# Patient Record
Sex: Male | Born: 1975 | Race: Black or African American | Hispanic: No | Marital: Single | Smoking: Former smoker
Health system: Southern US, Community
[De-identification: ages and names within clinical notes are randomized; demographics above are authoritative.]

## PROBLEM LIST (undated history)

## (undated) DIAGNOSIS — R519 Headache, unspecified: Secondary | ICD-10-CM

## (undated) DIAGNOSIS — K219 Gastro-esophageal reflux disease without esophagitis: Secondary | ICD-10-CM

## (undated) HISTORY — PX: WISDOM TOOTH EXTRACTION: SHX21

## (undated) HISTORY — PX: FOOT SURGERY: SHX648

---
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
## 2005-12-30 ENCOUNTER — Emergency Department (HOSPITAL_COMMUNITY): Admission: EM | Admit: 2005-12-30 | Discharge: 2005-12-30 | Payer: Self-pay | Admitting: Emergency Medicine

## 2006-11-13 ENCOUNTER — Emergency Department (HOSPITAL_COMMUNITY): Admission: EM | Admit: 2006-11-13 | Discharge: 2006-11-13 | Payer: Self-pay | Admitting: Emergency Medicine

## 2008-03-25 ENCOUNTER — Emergency Department (HOSPITAL_COMMUNITY): Admission: EM | Admit: 2008-03-25 | Discharge: 2008-03-25 | Payer: Self-pay | Admitting: Emergency Medicine

## 2008-03-25 ENCOUNTER — Encounter (INDEPENDENT_AMBULATORY_CARE_PROVIDER_SITE_OTHER): Payer: Self-pay | Admitting: Emergency Medicine

## 2008-03-25 ENCOUNTER — Ambulatory Visit: Payer: Self-pay | Admitting: Vascular Surgery

## 2010-01-26 ENCOUNTER — Emergency Department (HOSPITAL_COMMUNITY): Admission: EM | Admit: 2010-01-26 | Discharge: 2010-01-26 | Payer: Self-pay | Admitting: Emergency Medicine

## 2011-02-04 ENCOUNTER — Emergency Department (HOSPITAL_COMMUNITY)
Admission: EM | Admit: 2011-02-04 | Discharge: 2011-02-04 | Disposition: A | Discharge status: Home / Self Care | Payer: Self-pay | Attending: Emergency Medicine | Admitting: Emergency Medicine

## 2011-02-04 DIAGNOSIS — H109 Unspecified conjunctivitis: Secondary | ICD-10-CM | POA: Insufficient documentation

## 2011-02-04 DIAGNOSIS — H5789 Other specified disorders of eye and adnexa: Secondary | ICD-10-CM | POA: Insufficient documentation

## 2012-04-07 ENCOUNTER — Emergency Department (HOSPITAL_COMMUNITY)
Admission: EM | Admit: 2012-04-07 | Discharge: 2012-04-07 | Disposition: A | Discharge status: Home / Self Care | Payer: Self-pay | Attending: Emergency Medicine | Admitting: Emergency Medicine

## 2012-04-07 ENCOUNTER — Encounter (HOSPITAL_COMMUNITY): Payer: Self-pay | Admitting: *Deleted

## 2012-04-07 DIAGNOSIS — R599 Enlarged lymph nodes, unspecified: Secondary | ICD-10-CM | POA: Insufficient documentation

## 2012-04-07 DIAGNOSIS — J029 Acute pharyngitis, unspecified: Secondary | ICD-10-CM | POA: Insufficient documentation

## 2012-04-07 LAB — RAPID STREP SCREEN (MED CTR MEBANE ONLY): Streptococcus, Group A Screen (Direct): NEGATIVE

## 2012-04-07 MED ORDER — OXYCODONE-ACETAMINOPHEN 5-325 MG PO TABS
1.0000 | ORAL_TABLET | Freq: Once | ORAL | Status: AC
  Administered 2012-04-07: 1 via ORAL
  Filled 2012-04-07: qty 1

## 2012-04-07 MED ORDER — OXYCODONE-ACETAMINOPHEN 5-325 MG PO TABS: 1.0000 | ORAL_TABLET | Freq: Four times a day (QID) | ORAL | Status: AC | PRN

## 2012-04-07 MED ORDER — PREDNISONE 20 MG PO TABS: 40.0000 mg | ORAL_TABLET | Freq: Every day | ORAL | Status: DC

## 2012-04-07 NOTE — ED Provider Notes (Signed)
History     CSN: [id number]  Arrival date & time 04/07/12  1621   First MD Initiated Contact with Patient 04/07/12 1710      Chief Complaint  Patient presents with  . Dysphagia    (Consider location/radiation/quality/duration/timing/severity/associated sxs/prior treatment) The history is provided by the patient.   she's had difficulty swallowing for last 3 weeks. He states it comes and goes. States he choked and it trouble breathing today. It is worse on left side. No fevers. He states he has some swelling in his neck. No chest pain. No weight loss.  History reviewed. No pertinent past medical history.  Past Surgical History  Procedure Date  . Foot surgery     1997    No family history on file.  History  Substance Use Topics  . Smoking status: Current Everyday Smoker -- 1.0 packs/day    Types: Cigarettes  . Smokeless tobacco: Not on file  . Alcohol Use: Yes     occa      Review of Systems  Constitutional: Negative for fever and fatigue.  HENT: Positive for sore throat and trouble swallowing. Negative for dental problem and sinus pressure.   Respiratory: Positive for choking.   Cardiovascular: Negative for chest pain.  Gastrointestinal: Negative for abdominal pain.  Musculoskeletal: Negative for back pain.  Neurological: Negative for headaches.  Hematological: Positive for adenopathy.    Allergies  Review of patient's allergies indicates no known allergies.  Home Medications   Current Outpatient Rx  Name Route Sig Dispense Refill  . OXYCODONE-ACETAMINOPHEN 5-325 MG PO TABS Oral Take 1-2 tablets by mouth every 6 (six) hours as needed for pain. 10 tablet 0  . PREDNISONE 20 MG PO TABS Oral Take 2 tablets (40 mg total) by mouth daily. 6 tablet 0    BP 133/83  Pulse 68  Temp(Src) 98.8 F (37.1 C) (Oral)  Resp 20  SpO2 99%  Physical Exam  Constitutional: He is oriented to person, place, and time. He appears well-developed and well-nourished.  HENT:    Mouth/Throat: No oropharyngeal exudate.       Mild posterior pharyngeal erythema with tiny vesicles.  Eyes: Conjunctivae are normal. Pupils are equal, round, and reactive to light.  Neck:       Left-sided anterior cervical lymphadenopathy  Cardiovascular: Normal rate and regular rhythm.   Pulmonary/Chest: Effort normal. No respiratory distress. He has no wheezes.  Abdominal: There is no tenderness.  Lymphadenopathy:    He has cervical adenopathy.  Neurological: He is alert and oriented to person, place, and time.    ED Course  Procedures (including critical care time)   Labs Reviewed  RAPID STREP SCREEN   No results found.   1. Pharyngitis       MDM  Patient with sore throat. He has posterior frontal erythema with small vesicular lesions. Also has some mild anterior cervical lymphadenopathy. Negative strep test. Patient discharged home with some prednisone and oxycodone. He'll followup with ENT as needed        Juliet Rude. Rubin Payor, MD 04/07/12 1920

## 2012-04-07 NOTE — ED Notes (Signed)
Pt reports dysphagia x 3 weeks.  Pt reports swollen neck but denies any difficulty breathing at this time.

## 2012-04-07 NOTE — Discharge Instructions (Signed)

## 2014-07-29 ENCOUNTER — Emergency Department (HOSPITAL_COMMUNITY)
Admission: EM | Admit: 2014-07-29 | Discharge: 2014-07-29 | Disposition: A | Discharge status: Home / Self Care | Payer: Self-pay | Attending: Emergency Medicine | Admitting: Emergency Medicine

## 2014-07-29 ENCOUNTER — Encounter (HOSPITAL_COMMUNITY): Payer: Self-pay | Admitting: Emergency Medicine

## 2014-07-29 DIAGNOSIS — J029 Acute pharyngitis, unspecified: Secondary | ICD-10-CM | POA: Insufficient documentation

## 2014-07-29 DIAGNOSIS — F17200 Nicotine dependence, unspecified, uncomplicated: Secondary | ICD-10-CM | POA: Insufficient documentation

## 2014-07-29 LAB — RAPID STREP SCREEN (MED CTR MEBANE ONLY): Streptococcus, Group A Screen (Direct): NEGATIVE

## 2014-07-29 MED ORDER — IBUPROFEN 800 MG PO TABS
800.0000 mg | ORAL_TABLET | Freq: Once | ORAL | Status: AC
  Administered 2014-07-29: 800 mg via ORAL
  Filled 2014-07-29: qty 1

## 2014-07-29 MED ORDER — IBUPROFEN 800 MG PO TABS: 800.0000 mg | ORAL_TABLET | Freq: Three times a day (TID) | ORAL | Status: DC

## 2014-07-29 MED ORDER — FLUTICASONE PROPIONATE 50 MCG/ACT NA SUSP: 2.0000 | Freq: Every day | NASAL | Status: DC

## 2014-07-29 NOTE — Discharge Instructions (Signed)
Sore Throat A sore throat is a painful, burning, sore, or scratchy feeling of the throat. There may be pain or tenderness when swallowing or talking. You may have other symptoms with a sore throat. These include coughing, sneezing, fever, or a swollen neck. A sore throat is often the first sign of another sickness. These sicknesses may include a cold, flu, strep throat, or an infection called mono. Most sore throats go away without medical treatment.  HOME CARE   Only take medicine as told by your doctor.  Drink enough fluids to keep your pee (urine) clear or pale yellow.  Rest as needed.  Try using throat sprays, lozenges, or suck on hard candy (if older than 4 years or as told).  Sip warm liquids, such as broth, herbal tea, or warm water with honey. Try sucking on frozen ice pops or drinking cold liquids.  Rinse the mouth (gargle) with salt water. Mix 1 teaspoon salt with 8 ounces of water.  Do not smoke. Avoid being around others when they are smoking.  Put a humidifier in your bedroom at night to moisten the air. You can also turn on a hot shower and sit in the bathroom for 5-10 minutes. Be sure the bathroom door is closed. GET HELP RIGHT AWAY IF:   You have trouble breathing.  You cannot swallow fluids, soft foods, or your spit (saliva).  You have more puffiness (swelling) in the throat.  Your sore throat does not get better in 7 days.  You feel sick to your stomach (nauseous) and throw up (vomit).  You have a fever or lasting symptoms for more than 2-3 days.  You have a fever and your symptoms suddenly get worse. MAKE SURE YOU:   Understand these instructions.  Will watch your condition.  Will get help right away if you are not doing well or get worse. Document Released: 09/18/2008 Document Revised: 09/03/2012 Document Reviewed: 08/17/2012 Exit[employer] Patient Information 2015 [employer]. This information is not intended to replace advice given to you by your health  care provider. Make sure you discuss any questions you have with your health care provider.  

## 2014-07-29 NOTE — ED Notes (Signed)
Pt reports sore throat x 4 days that is painful to swallow with some cough that is productive. Denies fever at home and denies n/v/d.

## 2014-07-29 NOTE — ED Provider Notes (Signed)
CSN: [id number]     Arrival date & time 07/29/14  1513 History   First MD Initiated Contact with Patient 07/29/14 1545    This chart was scribed for non-physician practitioner, Junious SilkHannah Jestine Bicknell, working with Toy CookeyMegan Docherty, MD by Marica OtterNusrat Rahman, ED Scribe. This patient was seen in room WTR7/WTR7 and the patient's care was started at 4:46 PM.  No chief complaint on file.  The history is provided by the patient. No language interpreter was used.   HPI Comments: Phillip Wiley is a 38 y.o. male who presents to the Emergency Department complaining of constant sore throat onset 4 days ago. Pt states it is painful to swallow and complains of associated mild congestion. Pt reports using throat lozenges and gurgling with listerine at home without much relief. PT denies taking tylenol or ibuprofen at home.   Pt denies fever at home, n/v/d, rhinorrhea, cough, abd pain, or ear pain.   History reviewed. No pertinent past medical history. Past Surgical History  Procedure Laterality Date  . Foot surgery      1997   No family history on file. History  Substance Use Topics  . Smoking status: Current Every Day Smoker -- 1.00 packs/day    Types: Cigarettes  . Smokeless tobacco: Not on file  . Alcohol Use: Yes     Comment: occa    Review of Systems  Constitutional: Negative for fever and chills.  HENT: Positive for congestion and sore throat (painful to swallow). Negative for ear pain and rhinorrhea.   Gastrointestinal: Negative for abdominal pain.  Psychiatric/Behavioral: Negative for confusion.  All other systems reviewed and are negative.  Allergies  Review of patient's allergies indicates no known allergies.  Home Medications   Prior to Admission medications   Medication Sig Start Date End Date Taking? Authorizing Provider  Ascorbic Acid (HALLS VITAMIN C DROPS MT) Use as directed 1 each in the mouth or throat once as needed (sore throat).   Yes Historical Provider, MD   Triage Vitals: BP  124/82  Pulse 82  Temp(Src) 99.4 F (37.4 C) (Oral)  Resp 16  SpO2 100% Physical Exam  Nursing note and vitals reviewed. Constitutional: He is oriented to person, place, and time. He appears well-developed and well-nourished. No distress.  HENT:  Head: Normocephalic and atraumatic.  Right Ear: Tympanic membrane and external ear normal.  Left Ear: Tympanic membrane and external ear normal.  Nose: Nose normal.  Mouth/Throat: Uvula is midline. No trismus in the jaw. No uvula swelling. Posterior oropharyngeal erythema present.  Mild bilateral enlargement of tonsils. Post nasal drip.   Eyes: Conjunctivae are normal.  Neck: Normal range of motion. No tracheal deviation present.  Cardiovascular: Normal rate, regular rhythm and normal heart sounds.   Pulmonary/Chest: Effort normal and breath sounds normal. No stridor.  Abdominal: Soft. He exhibits no distension. There is no tenderness.  Musculoskeletal: Normal range of motion.  Lymphadenopathy:    He has cervical adenopathy.  Neurological: He is alert and oriented to person, place, and time.  Skin: Skin is warm and dry. He is not diaphoretic.  Psychiatric: He has a normal mood and affect. His behavior is normal.    ED Course  Procedures (including critical care time) DIAGNOSTIC STUDIES: Oxygen Saturation is 100% on RA, nl by my interpretation.    COORDINATION OF CARE: 4:49 PM-Discussed treatment plan which includes strep tests results and meds with pt at bedside and pt agreed to plan.   Labs Review Labs Reviewed  RAPID STREP SCREEN  CULTURE, GROUP A STREP    Imaging Review No results found.   EKG Interpretation None      MDM   Final diagnoses:  Viral pharyngitis    Pt afebrile without tonsillar exudate, negative strep. Presents with mild cervical lymphadenopathy, & dysphagia; diagnosis of viral pharyngitis. No abx indicated. DC w symptomatic tx for pain  Pt does not appear dehydrated, but did discuss importance of  water rehydration. Presentation non concerning for PTA or infxn spread to soft tissue. No trismus or uvula deviation. Specific return precautions discussed. Pt able to drink water in ED without difficulty with intact air way. Recommended PCP follow up.   I personally performed the services described in this documentation, which was scribed in my presence. The recorded information has been reviewed and is accurate.    Mora Bellman, PA-C 07/29/14 2027

## 2014-07-30 NOTE — ED Provider Notes (Signed)
Medical screening examination/treatment/procedure(s) were performed by non-physician practitioner and as supervising physician I was immediately available for consultation/collaboration.  Toy CookeyMegan Docherty, MD 07/30/14 [phone]

## 2014-07-31 LAB — CULTURE, GROUP A STREP

## 2014-11-24 ENCOUNTER — Encounter (HOSPITAL_COMMUNITY): Payer: Self-pay | Admitting: Emergency Medicine

## 2014-11-24 ENCOUNTER — Emergency Department (HOSPITAL_COMMUNITY)
Admission: EM | Admit: 2014-11-24 | Discharge: 2014-11-24 | Disposition: A | Discharge status: Home / Self Care | Payer: Worker's Compensation | Attending: Emergency Medicine | Admitting: Emergency Medicine

## 2014-11-24 ENCOUNTER — Emergency Department (HOSPITAL_COMMUNITY): Payer: Worker's Compensation

## 2014-11-24 DIAGNOSIS — Z880 Allergy status to penicillin: Secondary | ICD-10-CM | POA: Insufficient documentation

## 2014-11-24 DIAGNOSIS — W228XXA Striking against or struck by other objects, initial encounter: Secondary | ICD-10-CM | POA: Insufficient documentation

## 2014-11-24 DIAGNOSIS — Z720 Tobacco use: Secondary | ICD-10-CM | POA: Diagnosis not present

## 2014-11-24 DIAGNOSIS — S6991XA Unspecified injury of right wrist, hand and finger(s), initial encounter: Secondary | ICD-10-CM | POA: Diagnosis present

## 2014-11-24 DIAGNOSIS — M67431 Ganglion, right wrist: Secondary | ICD-10-CM | POA: Diagnosis not present

## 2014-11-24 DIAGNOSIS — Y9389 Activity, other specified: Secondary | ICD-10-CM | POA: Insufficient documentation

## 2014-11-24 DIAGNOSIS — Y998 Other external cause status: Secondary | ICD-10-CM | POA: Insufficient documentation

## 2014-11-24 DIAGNOSIS — Y9289 Other specified places as the place of occurrence of the external cause: Secondary | ICD-10-CM | POA: Diagnosis not present

## 2014-11-24 MED ORDER — TRAMADOL HCL 50 MG PO TABS: 50.0000 mg | ORAL_TABLET | Freq: Four times a day (QID) | ORAL | Status: DC | PRN

## 2014-11-24 MED ORDER — IBUPROFEN 800 MG PO TABS
800.0000 mg | ORAL_TABLET | Freq: Once | ORAL | Status: AC
  Administered 2014-11-24: 800 mg via ORAL
  Filled 2014-11-24: qty 1

## 2014-11-24 NOTE — Discharge Instructions (Signed)

## 2014-11-24 NOTE — ED Provider Notes (Signed)
CSN: [id number]     Arrival date & time 11/24/14  1009 History   First MD Initiated Contact with Patient 11/24/14 1038     Chief Complaint  Patient presents with  . Hand Pain     (Consider location/radiation/quality/duration/timing/severity/associated sxs/prior Treatment) HPI Patient to the ED with complaints of right hand/wrist pain. He hit his hand on the door last Wednesday and since then has noticed a large bump on the back of his hand that is tender, but his pain is worse throughout the wrist. The pain is worsened by touch and with moving the wrist. He has not noticed redness, increased warmth or drainage. He did not have a skin injury with the hand injury. He reports looking it up online and finding that it is a "ganglion cyst". Otherwise has been feeling well, no fevers, nausea, vomiting, diarrhea, abdominal pain, SOB, CP, and diaphoresis.   History reviewed. No pertinent past medical history. Past Surgical History  Procedure Laterality Date  . Foot surgery      1997   No family history on file. History  Substance Use Topics  . Smoking status: Current Every Day Smoker -- 1.00 packs/day    Types: Cigarettes  . Smokeless tobacco: Not on file  . Alcohol Use: Yes     Comment: occa    Review of Systems  10 Systems reviewed and are negative for acute change except as noted in the HPI.    Allergies  Amoxicillin  Home Medications   Prior to Admission medications   Medication Sig Start Date End Date Taking? Authorizing Provider  fluticasone (FLONASE) 50 MCG/ACT nasal spray Place 2 sprays into both nostrils daily. Patient not taking: Reported on 11/24/2014 07/29/14   Mora BellmanHannah S Merrell, PA-C  ibuprofen (ADVIL,MOTRIN) 800 MG tablet Take 1 tablet (800 mg total) by mouth 3 (three) times daily. Patient not taking: Reported on 11/24/2014 07/29/14   Mora BellmanHannah S Merrell, PA-C  traMADol (ULTRAM) 50 MG tablet Take 1 tablet (50 mg total) by mouth every 6 (six) hours as needed. 11/24/14   Bernadett Milian  Irine SealG Taraya Steward, PA-C   BP 129/72 mmHg  Pulse 70  Temp(Src) 98.4 F (36.9 C) (Oral)  Resp 18  SpO2 97% Physical Exam  Constitutional: He appears well-developed and well-nourished. No distress.  HENT:  Head: Normocephalic and atraumatic.  Eyes: Pupils are equal, round, and reactive to light.  Neck: Normal range of motion. Neck supple.  Cardiovascular: Normal rate and regular rhythm.   Pulmonary/Chest: Effort normal.  Abdominal: Soft.  Musculoskeletal:       Arms: Neurological: He is alert.  Skin: Skin is warm and dry.  Nursing note and vitals reviewed.    ED Course  Procedures (including critical care time) Labs Review Labs Reviewed - No data to display  Imaging Review Dg Hand Complete Right  11/24/2014   CLINICAL DATA:  Acute right hand pain and swelling after hitting hand on door.  EXAM: RIGHT HAND - COMPLETE 3+ VIEW  COMPARISON:  None.  FINDINGS: There is no evidence of fracture or dislocation. There is no evidence of arthropathy or other focal bone abnormality. Soft tissues are unremarkable.  IMPRESSION: Normal right hand.   Electronically Signed   By: Roque LiasJames  Green M.D.   On: 11/24/2014 10:44     EKG Interpretation None      MDM   Final diagnoses:  Ganglion cyst of wrist, right    Wrist splint, NSAIDS, pain medication And follow-up with hand if he wants it removed or  it continues to hurt.  38 y.o.Phillip Wiley's evaluation in the Emergency Department is complete. It has been determined that no acute conditions requiring further emergency intervention are present at this time. The patient/guardian have been advised of the diagnosis and plan. We have discussed signs and symptoms that warrant return to the ED, such as changes or worsening in symptoms.  Vital signs are stable at discharge. Filed Vitals:   11/24/14 1146  BP:   Pulse: 70  Temp:   Resp:     Patient/guardian has voiced understanding and agreed to follow-up with the PCP or  specialist.     Dorthula Matasiffany G Ravinder Lukehart, PA-C 11/24/14 1201  Raeford RazorStephen Kohut, MD 11/24/14 1943

## 2014-11-24 NOTE — ED Notes (Signed)
Pt states he hit his hand on a door last Wednesday and has been having rt hand pain since.  Came last night but wait was too long.  Lump noted to top of rt hand.

## 2015-07-17 ENCOUNTER — Encounter (HOSPITAL_COMMUNITY): Payer: Self-pay

## 2015-07-17 ENCOUNTER — Emergency Department (HOSPITAL_COMMUNITY)
Admission: EM | Admit: 2015-07-17 | Discharge: 2015-07-17 | Disposition: A | Discharge status: Home / Self Care | Payer: Self-pay | Attending: Emergency Medicine | Admitting: Emergency Medicine

## 2015-07-17 DIAGNOSIS — Z880 Allergy status to penicillin: Secondary | ICD-10-CM | POA: Insufficient documentation

## 2015-07-17 DIAGNOSIS — Z720 Tobacco use: Secondary | ICD-10-CM | POA: Insufficient documentation

## 2015-07-17 DIAGNOSIS — Y998 Other external cause status: Secondary | ICD-10-CM | POA: Insufficient documentation

## 2015-07-17 DIAGNOSIS — Y92002 Bathroom of unspecified non-institutional (private) residence single-family (private) house as the place of occurrence of the external cause: Secondary | ICD-10-CM | POA: Insufficient documentation

## 2015-07-17 DIAGNOSIS — S60412A Abrasion of right middle finger, initial encounter: Secondary | ICD-10-CM | POA: Insufficient documentation

## 2015-07-17 DIAGNOSIS — Z7951 Long term (current) use of inhaled steroids: Secondary | ICD-10-CM | POA: Insufficient documentation

## 2015-07-17 DIAGNOSIS — Y9389 Activity, other specified: Secondary | ICD-10-CM | POA: Insufficient documentation

## 2015-07-17 DIAGNOSIS — S60419A Abrasion of unspecified finger, initial encounter: Secondary | ICD-10-CM

## 2015-07-17 DIAGNOSIS — W228XXA Striking against or struck by other objects, initial encounter: Secondary | ICD-10-CM | POA: Insufficient documentation

## 2015-07-17 MED ORDER — LIDOCAINE HCL 1 % IJ SOLN
INTRAMUSCULAR | Status: AC
  Administered 2015-07-17: 22:00:00
  Filled 2015-07-17: qty 20

## 2015-07-17 MED ORDER — IBUPROFEN 800 MG PO TABS: 800.0000 mg | ORAL_TABLET | Freq: Three times a day (TID) | ORAL | Status: DC

## 2015-07-17 NOTE — ED Notes (Signed)
Pt presents with c/o right middle finger laceration that occurred earlier today after the rubber part of the broom came off and he cut himself on the end of the broom. Pt reports the laceration is approx one inch, bleeding is showing through the band-aid at this time but is controlled.

## 2015-07-17 NOTE — ED Provider Notes (Signed)
CSN: [id number]     Arrival date & time 07/17/15  1928 History   First MD Initiated Contact with Patient 07/17/15 2049     Chief Complaint  Patient presents with  . Extremity Laceration     (Consider location/radiation/quality/duration/timing/severity/associated sxs/prior Treatment) HPI Comments: Patient presents with laceration to right middle finger occurring this morning. He was using a broom this morning with a broken plastic edge on the handle that caused laceration to distal lateral finger. No other injury. He spent today trying to get bleeding under control but could not, prompting visit to emergency department.   The history is provided by the patient. No language interpreter was used.    History reviewed. No pertinent past medical history. Past Surgical History  Procedure Laterality Date  . Foot surgery      1997   No family history on file. History  Substance Use Topics  . Smoking status: Current Every Day Smoker -- 1.00 packs/day    Types: Cigarettes  . Smokeless tobacco: Not on file  . Alcohol Use: Yes     Comment: occa    Review of Systems  Constitutional: Negative for fever and chills.  Musculoskeletal: Negative.   Skin:       Laceration.  Neurological: Negative.  Negative for numbness.      Allergies  Amoxicillin  Home Medications   Prior to Admission medications   Medication Sig Start Date End Date Taking? Authorizing Provider  fluticasone (FLONASE) 50 MCG/ACT nasal spray Place 2 sprays into both nostrils daily. Patient not taking: Reported on 11/24/2014 07/29/14   Junious Silk, PA-C  ibuprofen (ADVIL,MOTRIN) 800 MG tablet Take 1 tablet (800 mg total) by mouth 3 (three) times daily. Patient not taking: Reported on 11/24/2014 07/29/14   Junious Silk, PA-C  traMADol (ULTRAM) 50 MG tablet Take 1 tablet (50 mg total) by mouth every 6 (six) hours as needed. 11/24/14   Tiffany Neva Seat, PA-C   BP 116/69 mmHg  Pulse 86  Temp(Src) 98 F (36.7 C) (Oral)   Resp 16  SpO2 98% Physical Exam  Constitutional: He is oriented to person, place, and time. He appears well-developed and well-nourished.  Neck: Normal range of motion.  Pulmonary/Chest: Effort normal.  Musculoskeletal: Normal range of motion.  Neurological: He is alert and oriented to person, place, and time.  Skin: Skin is warm and dry.  2 cm x 0.5 cm deep abrasion, non-suturable, to lateral aspect distal right middle finger. No active bleeding. Nail intact. FROM.  Psychiatric: He has a normal mood and affect.    ED Course  Procedures (including critical care time) Labs Review Labs Reviewed - No data to display  Imaging Review No results found.   EKG Interpretation None      MDM   Final diagnoses:  None    1. Finger abrasion  No active bleeding. Finger cleaned, nonstick bandage, care instructions provided.   Elpidio Anis, PA-C 07/17/15 2135  Purvis Sheffield, MD 07/18/15 [phone]

## 2015-07-17 NOTE — Discharge Instructions (Signed)
Abrasion °An abrasion is a cut or scrape of the skin. Abrasions do not extend through all layers of the skin and most heal within 10 days. It is important to care for your abrasion properly to prevent infection. °CAUSES  °Most abrasions are caused by falling on, or gliding across, the ground or other surface. When your skin rubs on something, the outer and inner layer of skin rubs off, causing an abrasion. °DIAGNOSIS  °Your caregiver will be able to diagnose an abrasion during a physical exam.  °TREATMENT  °Your treatment depends on how large and deep the abrasion is. Generally, your abrasion will be cleaned with water and a mild soap to remove any dirt or debris. An antibiotic ointment may be put over the abrasion to prevent an infection. A bandage (dressing) may be wrapped around the abrasion to keep it from getting dirty.  °You may need a tetanus shot if: °· You cannot remember when you had your last tetanus shot. °· You have never had a tetanus shot. °· The injury broke your skin. °If you get a tetanus shot, your arm may swell, get red, and feel warm to the touch. This is common and not a problem. If you need a tetanus shot and you choose not to have one, there is a rare chance of getting tetanus. Sickness from tetanus can be serious.  °HOME CARE INSTRUCTIONS  °· If a dressing was applied, change it at least once a day or as directed by your caregiver. If the bandage sticks, soak it off with warm water.   °· Wash the area with water and a mild soap to remove all the ointment 2 times a day. Rinse off the soap and pat the area dry with a clean towel.   °· Reapply any ointment as directed by your caregiver. This will help prevent infection and keep the bandage from sticking. Use gauze over the wound and under the dressing to help keep the bandage from sticking.   °· Change your dressing right away if it becomes wet or dirty.   °· Only take over-the-counter or prescription medicines for pain, discomfort, or fever as  directed by your caregiver.   °· Follow up with your caregiver within 24-48 hours for a wound check, or as directed. If you were not given a wound-check appointment, look closely at your abrasion for redness, swelling, or pus. These are signs of infection. °SEEK IMMEDIATE MEDICAL CARE IF:  °· You have increasing pain in the wound.   °· You have redness, swelling, or tenderness around the wound.   °· You have pus coming from the wound.   °· You have a fever or persistent symptoms for more than 2-3 days. °· You have a fever and your symptoms suddenly get worse. °· You have a bad smell coming from the wound or dressing.   °MAKE SURE YOU:  °· Understand these instructions. °· Will watch your condition. °· Will get help right away if you are not doing well or get worse. °Document Released: 09/19/2005 Document Revised: 11/26/2012 Document Reviewed: 11/13/2011 °[employer]® Patient Information ©2015 [employer]. This information is not intended to replace advice given to you by your health care provider. Make sure you discuss any questions you have with your health care provider. ° °Wound Care °Wound care helps prevent pain and infection.  °You may need a tetanus shot if: °· You cannot remember when you had your last tetanus shot. °· You have never had a tetanus shot. °· The injury broke your skin. °If you need   a tetanus shot and you choose not to have one, you may get tetanus. Sickness from tetanus can be serious. °HOME CARE  °· Only take medicine as told by your doctor. °· Clean the wound daily with mild soap and water. °· Change any bandages (dressings) as told by your doctor. °· Put medicated cream and a bandage on the wound as told by your doctor. °· Change the bandage if it gets wet, dirty, or starts to smell. °· Take showers. Do not take baths, swim, or do anything that puts your wound under water. °· Rest and raise (elevate) the wound until the pain and puffiness (swelling) are better. °· Keep all doctor visits as  told. °GET HELP RIGHT AWAY IF:  °· Yellowish-white fluid (pus) comes from the wound. °· Medicine does not lessen your pain. °· There is a red streak going away from the wound. °· You have a fever. °MAKE SURE YOU:  °· Understand these instructions. °· Will watch your condition. °· Will get help right away if you are not doing well or get worse. °Document Released: 09/18/2008 Document Revised: 03/03/2012 Document Reviewed: 04/15/2011 °ExitCare® Patient Information ©2015 [employer]. This information is not intended to replace advice given to you by your health care provider. Make sure you discuss any questions you have with your health care provider. ° °

## 2015-07-26 ENCOUNTER — Ambulatory Visit
Admission: RE | Admit: 2015-07-26 | Discharge: 2015-07-26 | Disposition: A | Discharge status: Home / Self Care | Payer: Self-pay | Source: Ambulatory Visit | Attending: Radiation Oncology | Admitting: Radiation Oncology

## 2015-07-26 ENCOUNTER — Ambulatory Visit: Admission: RE | Admit: 2015-07-26 | Payer: Self-pay | Source: Ambulatory Visit | Admitting: Radiation Oncology

## 2015-07-26 ENCOUNTER — Encounter: Payer: Self-pay | Admitting: Radiation Oncology

## 2016-02-26 ENCOUNTER — Emergency Department (HOSPITAL_COMMUNITY): Payer: BLUE CROSS/BLUE SHIELD

## 2016-02-26 ENCOUNTER — Encounter (HOSPITAL_COMMUNITY): Payer: Self-pay | Admitting: Nurse Practitioner

## 2016-02-26 ENCOUNTER — Emergency Department (HOSPITAL_COMMUNITY)
Admission: EM | Admit: 2016-02-26 | Discharge: 2016-02-26 | Disposition: A | Discharge status: Home / Self Care | Payer: BLUE CROSS/BLUE SHIELD | Attending: Emergency Medicine | Admitting: Emergency Medicine

## 2016-02-26 DIAGNOSIS — Y92410 Unspecified street and highway as the place of occurrence of the external cause: Secondary | ICD-10-CM | POA: Diagnosis not present

## 2016-02-26 DIAGNOSIS — Y998 Other external cause status: Secondary | ICD-10-CM | POA: Insufficient documentation

## 2016-02-26 DIAGNOSIS — F17210 Nicotine dependence, cigarettes, uncomplicated: Secondary | ICD-10-CM | POA: Insufficient documentation

## 2016-02-26 DIAGNOSIS — Z880 Allergy status to penicillin: Secondary | ICD-10-CM | POA: Diagnosis not present

## 2016-02-26 DIAGNOSIS — Z791 Long term (current) use of non-steroidal anti-inflammatories (NSAID): Secondary | ICD-10-CM | POA: Insufficient documentation

## 2016-02-26 DIAGNOSIS — S6992XA Unspecified injury of left wrist, hand and finger(s), initial encounter: Secondary | ICD-10-CM | POA: Diagnosis not present

## 2016-02-26 DIAGNOSIS — M545 Low back pain, unspecified: Secondary | ICD-10-CM

## 2016-02-26 DIAGNOSIS — Z7951 Long term (current) use of inhaled steroids: Secondary | ICD-10-CM | POA: Diagnosis not present

## 2016-02-26 DIAGNOSIS — M25532 Pain in left wrist: Secondary | ICD-10-CM

## 2016-02-26 DIAGNOSIS — Y9389 Activity, other specified: Secondary | ICD-10-CM | POA: Insufficient documentation

## 2016-02-26 DIAGNOSIS — S29002A Unspecified injury of muscle and tendon of back wall of thorax, initial encounter: Secondary | ICD-10-CM | POA: Diagnosis not present

## 2016-02-26 DIAGNOSIS — S3992XA Unspecified injury of lower back, initial encounter: Secondary | ICD-10-CM | POA: Insufficient documentation

## 2016-02-26 MED ORDER — IBUPROFEN 800 MG PO TABS
800.0000 mg | ORAL_TABLET | Freq: Once | ORAL | Status: AC
  Administered 2016-02-26: 800 mg via ORAL
  Filled 2016-02-26: qty 1

## 2016-02-26 MED ORDER — METHOCARBAMOL 500 MG PO TABS: 1000.0000 mg | ORAL_TABLET | Freq: Four times a day (QID) | ORAL | Status: DC

## 2016-02-26 MED ORDER — NAPROXEN 500 MG PO TABS: 500.0000 mg | ORAL_TABLET | Freq: Two times a day (BID) | ORAL | Status: DC

## 2016-02-26 MED ORDER — METHOCARBAMOL 500 MG PO TABS
1000.0000 mg | ORAL_TABLET | Freq: Once | ORAL | Status: AC
  Administered 2016-02-26: 1000 mg via ORAL
  Filled 2016-02-26: qty 2
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
## 2016-03-03 ENCOUNTER — Ambulatory Visit (INDEPENDENT_AMBULATORY_CARE_PROVIDER_SITE_OTHER): Payer: BLUE CROSS/BLUE SHIELD | Admitting: Family Medicine

## 2016-03-03 VITALS — BP 122/74 | HR 65 | Temp 98.1°F | Resp 12 | Ht 72.0 in | Wt 175.0 lb

## 2016-03-03 DIAGNOSIS — S39012A Strain of muscle, fascia and tendon of lower back, initial encounter: Secondary | ICD-10-CM | POA: Diagnosis not present

## 2016-03-03 MED ORDER — PREDNISONE 20 MG PO TABS: ORAL_TABLET | ORAL | Status: DC

## 2016-03-03 MED ORDER — TRAMADOL HCL 50 MG PO TABS: 50.0000 mg | ORAL_TABLET | Freq: Three times a day (TID) | ORAL | Status: DC | PRN

## 2016-03-03 NOTE — Patient Instructions (Addendum)
IF you received an x-ray today, you will receive an invoice from [facility]. Please contact [facility] at [phone] with questions or concerns regarding your invoice.   IF you received labwork today, you will receive an invoice from [facility]. Please contact [facility] at [phone] with questions or concerns regarding your invoice.   Our billing staff will not be able to assist you with questions regarding bills from these companies.  You will be contacted with the lab results as soon as they are available. The fastest way to get your results is to activate your My Chart account. Instructions are located on the last page of this paperwork. If you have not heard from us regarding the results in 2 weeks, please contact this office.  Please return some time on Wednesday for recheck
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
## 2016-03-08 ENCOUNTER — Other Ambulatory Visit: Payer: Self-pay | Admitting: Family Medicine

## 2016-03-09 NOTE — Telephone Encounter (Signed)
Pt got a 10 day supplyon 3/11. Do you want to RF? Do you want a RF date on it? Pended w/date.

## 2016-03-10 ENCOUNTER — Emergency Department (INDEPENDENT_AMBULATORY_CARE_PROVIDER_SITE_OTHER)
Admission: EM | Admit: 2016-03-10 | Discharge: 2016-03-10 | Disposition: A | Discharge status: Home / Self Care | Payer: Self-pay | Source: Home / Self Care | Attending: Family Medicine | Admitting: Family Medicine

## 2016-03-10 ENCOUNTER — Encounter (HOSPITAL_COMMUNITY): Payer: Self-pay | Admitting: Emergency Medicine

## 2016-03-10 DIAGNOSIS — S39012A Strain of muscle, fascia and tendon of lower back, initial encounter: Secondary | ICD-10-CM

## 2016-03-10 DIAGNOSIS — M5442 Lumbago with sciatica, left side: Secondary | ICD-10-CM

## 2016-03-10 MED ORDER — TRAMADOL HCL 50 MG PO TABS: 50.0000 mg | ORAL_TABLET | Freq: Three times a day (TID) | ORAL | Status: DC | PRN

## 2016-03-10 MED ORDER — CYCLOBENZAPRINE HCL 10 MG PO TABS: 10.0000 mg | ORAL_TABLET | Freq: Three times a day (TID) | ORAL | Status: DC | PRN
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
## 2016-03-10 NOTE — Discharge Instructions (Signed)

## 2016-05-08 ENCOUNTER — Telehealth: Payer: Self-pay

## 2016-05-08 DIAGNOSIS — G8929 Other chronic pain: Secondary | ICD-10-CM

## 2016-05-08 DIAGNOSIS — M545 Low back pain: Principal | ICD-10-CM

## 2016-05-08 NOTE — Telephone Encounter (Signed)
Pt left message wanting to know if he can be referred to a Pain Management Clinic please

## 2016-06-21 ENCOUNTER — Emergency Department (HOSPITAL_COMMUNITY)
Admission: EM | Admit: 2016-06-21 | Discharge: 2016-06-21 | Disposition: A | Discharge status: Home / Self Care | Payer: BLUE CROSS/BLUE SHIELD | Attending: Emergency Medicine | Admitting: Emergency Medicine

## 2016-06-21 ENCOUNTER — Emergency Department (HOSPITAL_COMMUNITY): Payer: BLUE CROSS/BLUE SHIELD

## 2016-06-21 ENCOUNTER — Encounter (HOSPITAL_COMMUNITY): Payer: Self-pay

## 2016-06-21 DIAGNOSIS — Y999 Unspecified external cause status: Secondary | ICD-10-CM | POA: Insufficient documentation

## 2016-06-21 DIAGNOSIS — Y92410 Unspecified street and highway as the place of occurrence of the external cause: Secondary | ICD-10-CM | POA: Diagnosis not present

## 2016-06-21 DIAGNOSIS — Z79899 Other long term (current) drug therapy: Secondary | ICD-10-CM | POA: Insufficient documentation

## 2016-06-21 DIAGNOSIS — M545 Low back pain: Secondary | ICD-10-CM | POA: Diagnosis present

## 2016-06-21 DIAGNOSIS — F1290 Cannabis use, unspecified, uncomplicated: Secondary | ICD-10-CM | POA: Insufficient documentation

## 2016-06-21 DIAGNOSIS — Z7952 Long term (current) use of systemic steroids: Secondary | ICD-10-CM | POA: Diagnosis not present

## 2016-06-21 DIAGNOSIS — Z87891 Personal history of nicotine dependence: Secondary | ICD-10-CM | POA: Insufficient documentation

## 2016-06-21 DIAGNOSIS — Z7951 Long term (current) use of inhaled steroids: Secondary | ICD-10-CM | POA: Insufficient documentation

## 2016-06-21 DIAGNOSIS — M5442 Lumbago with sciatica, left side: Secondary | ICD-10-CM | POA: Diagnosis not present

## 2016-06-21 DIAGNOSIS — Y9389 Activity, other specified: Secondary | ICD-10-CM | POA: Insufficient documentation

## 2016-06-21 DIAGNOSIS — Z791 Long term (current) use of non-steroidal anti-inflammatories (NSAID): Secondary | ICD-10-CM | POA: Diagnosis not present

## 2016-06-21 MED ORDER — ACETAMINOPHEN 500 MG PO TABS
1000.0000 mg | ORAL_TABLET | Freq: Once | ORAL | Status: AC
  Administered 2016-06-21: 1000 mg via ORAL
  Filled 2016-06-21: qty 2

## 2016-06-21 MED ORDER — TRAMADOL HCL 50 MG PO TABS: 50.0000 mg | ORAL_TABLET | Freq: Four times a day (QID) | ORAL | Status: DC | PRN

## 2016-06-21 MED ORDER — METHOCARBAMOL 500 MG PO TABS: 500.0000 mg | ORAL_TABLET | Freq: Two times a day (BID) | ORAL | Status: DC

## 2016-06-21 MED ORDER — IBUPROFEN 200 MG PO TABS
400.0000 mg | ORAL_TABLET | Freq: Once | ORAL | Status: AC
  Administered 2016-06-21: 400 mg via ORAL
  Filled 2016-06-21: qty 2

## 2016-06-21 NOTE — ED Notes (Signed)
PT DISCHARGED. INSTRUCTIONS AND PRESCRIPTIONS GIVEN. AAOX4. PT IN NO APPARENT DISTRESS. THE OPPORTUNITY TO ASK QUESTIONS WAS PROVIDED. 

## 2016-06-21 NOTE — Discharge Instructions (Signed)
Please read and follow all provided instructions.  Your diagnoses today include:  1. Midline low back pain with left-sided sciatica    Tests performed today include:  Vital signs - see below for your results today  Medications prescribed:   Take any prescribed medications only as directed.  You can use Ibuprofen 400mg  combined with Tylenol 1000mg  for pain relief every 6 hours. Do not exceed 4g of Tylenol in one 24 hour period.  Use narcotics if pain uncontrolled with the aforementioned regiment. Do not exceed 10 days of this regiment.  Home care instructions:   Follow any educational materials contained in this packet  Please rest, use ice or heat on your back for the next several days  Do not lift, push, pull anything more than 10 pounds for the next week  Follow-up instructions: Please follow-up with your primary care provider in the next 1 week for further evaluation of your symptoms.   Return instructions:  SEEK IMMEDIATE MEDICAL ATTENTION IF YOU HAVE:  New numbness, tingling, weakness, or problem with the use of your arms or legs  Severe back pain not relieved with medications  Loss control of your bowels or bladder  Increasing pain in any areas of the body (such as chest or abdominal pain)  Shortness of breath, dizziness, or fainting.   Worsening nausea (feeling sick to your stomach), vomiting, fever, or sweats  Any other emergent concerns regarding your health   Additional Information:  Your vital signs today were: BP 128/70 mmHg   Pulse 63   Temp(Src) 98.8 F (37.1 C) (Oral)   Resp 20   SpO2 98% If your blood pressure (BP) was elevated above 135/85 this visit, please have this repeated by your doctor within one month. --------------
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
## 2016-06-29 ENCOUNTER — Emergency Department (HOSPITAL_COMMUNITY)
Admission: EM | Admit: 2016-06-29 | Discharge: 2016-06-29 | Disposition: A | Discharge status: Home / Self Care | Payer: BLUE CROSS/BLUE SHIELD | Attending: Emergency Medicine | Admitting: Emergency Medicine

## 2016-06-29 ENCOUNTER — Encounter (HOSPITAL_COMMUNITY): Payer: Self-pay | Admitting: Oncology

## 2016-06-29 DIAGNOSIS — Z7951 Long term (current) use of inhaled steroids: Secondary | ICD-10-CM | POA: Diagnosis not present

## 2016-06-29 DIAGNOSIS — Z87891 Personal history of nicotine dependence: Secondary | ICD-10-CM | POA: Insufficient documentation

## 2016-06-29 DIAGNOSIS — M545 Low back pain, unspecified: Secondary | ICD-10-CM

## 2016-06-29 DIAGNOSIS — F1290 Cannabis use, unspecified, uncomplicated: Secondary | ICD-10-CM | POA: Diagnosis not present

## 2016-06-29 MED ORDER — DIAZEPAM 5 MG PO TABS
5.0000 mg | ORAL_TABLET | Freq: Once | ORAL | Status: AC
  Administered 2016-06-29: 5 mg via ORAL
  Filled 2016-06-29: qty 1

## 2016-06-29 MED ORDER — MELOXICAM 7.5 MG PO TABS: 15.0000 mg | ORAL_TABLET | Freq: Every day | ORAL | Status: DC

## 2016-06-29 MED ORDER — KETOROLAC TROMETHAMINE 30 MG/ML IJ SOLN
30.0000 mg | Freq: Once | INTRAMUSCULAR | Status: AC
  Administered 2016-06-29: 30 mg via INTRAMUSCULAR
  Filled 2016-06-29: qty 1
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
## 2016-06-29 NOTE — Discharge Instructions (Signed)
There does not appear to be an emergent cause for your symptoms at this time. Please take your medications as prescribed. Follow-up with your doctor for further evaluation and management of your symptoms. Return to ED for new or worsening symptoms.  Back Pain, Adult Back pain is very common. The pain often gets better over time. The cause of back pain is usually not dangerous. Most people can learn to manage their back pain on their own.  HOME CARE  Watch your back pain for any changes. The following actions may help to lessen any pain you are feeling:  Stay active. Start with short walks on flat ground if you can. Try to walk farther each day.  Exercise regularly as told by your doctor. Exercise helps your back heal faster. It also helps avoid future injury by keeping your muscles strong and flexible.  Do not sit, drive, or stand in one place for more than 30 minutes.  Do not stay in bed. Resting more than 1-2 days can slow down your recovery.  Be careful when you bend or lift an object. Use good form when lifting:  Bend at your knees.  Keep the object close to your body.  Do not twist.  Sleep on a firm mattress. Lie on your side, and bend your knees. If you lie on your back, put a pillow under your knees.  Take medicines only as told by your doctor.  Put ice on the injured area.  Put ice in a plastic bag.  Place a towel between your skin and the bag.  Leave the ice on for 20 minutes, 2-3 times a day for the first 2-3 days. After that, you can switch between ice and heat packs.  Avoid feeling anxious or stressed. Find good ways to deal with stress, such as exercise.  Maintain a healthy weight. Extra weight puts stress on your back. GET HELP IF:   You have pain that does not go away with rest or medicine.  You have worsening pain that goes down into your legs or buttocks.  You have pain that does not get better in one week.  You have pain at night.  You lose  weight.  You have a fever or chills. GET HELP RIGHT AWAY IF:   You cannot control when you poop (bowel movement) or pee (urinate).  Your arms or legs feel weak.  Your arms or legs lose feeling (numbness).  You feel sick to your stomach (nauseous) or throw up (vomit).  You have belly (abdominal) pain.  You feel like you may pass out (faint).   This information is not intended to replace advice given to you by your health care provider. Make sure you discuss any questions you have with your health care provider.   Document Released: 05/28/2008 Document Revised: 12/31/2014 Document Reviewed: 04/13/2014 Elsevier Interactive Patient Education [employer]2016 [employer] Inc.

## 2016-07-10 ENCOUNTER — Other Ambulatory Visit: Payer: Self-pay | Admitting: Family Medicine
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
## 2022-04-06 ENCOUNTER — Ambulatory Visit: Payer: Self-pay | Admitting: Orthopedic Surgery

## 2022-04-18 NOTE — Pre-Procedure Instructions (Signed)
Surgical Instructions ? ? ? Your procedure is scheduled on Thursday 04/26/22. ? ? Report to [facility] Main Entrance "A" at 09:55 A.M., then check in with the Admitting office. ? Call this number if you have problems the morning of surgery: ? [phone] ? ? If you have any questions prior to your surgery date call [phone]: Open Monday-Friday 8am-4pm ? ? ? Remember: ? Do not eat after midnight the night before your surgery ? ?You may drink clear liquids until 08:55 A.M. the morning of your surgery.   ?Clear liquids allowed are: Water, Non-Citrus Juices (without pulp), Carbonated Beverages, Clear Tea, Black Coffee ONLY (NO MILK, CREAM OR POWDERED CREAMER of any kind), and Gatorade ?  ? Take these medicines the morning of surgery with A SIP OF WATER:  ? gabapentin (NEURONTIN) ? HYDROcodone-acetaminophen(NORCO/VICODIN)- If needed ? ? ?As of today, STOP taking any Aspirin (unless otherwise instructed by your surgeon) Aleve, Naproxen, Ibuprofen, Motrin, Advil, Goody's, BC's, all herbal medications, fish oil, and all vitamins. ? ?         ?Do not wear jewelry or makeup ?Do not wear lotions, powders, perfumes/colognes, or deodorant. ?Do not shave 48 hours prior to surgery.  Men may shave face and neck. ?Do not bring valuables to the hospital. ?Do not wear nail polish, gel polish, artificial nails, or any other type of covering on natural nails (fingers and toes) ?If you have artificial nails or gel coating that need to be removed by a nail salon, please have this removed prior to surgery. Artificial nails or gel coating may interfere with anesthesia's ability to adequately monitor your vital signs. ? ?[facility] is not responsible for any belongings or valuables. .  ? ?Do NOT Smoke (Tobacco/Vaping)  24 hours prior to your procedure ? ?If you use a CPAP at night, you may bring your mask for your overnight stay. ?  ?Contacts, glasses, hearing aids, dentures or partials may not be worn into surgery, please bring cases  for these belongings ?  ?For patients admitted to the hospital, discharge time will be determined by your treatment team. ?  ?Patients discharged the day of surgery will not be allowed to drive home, and someone needs to stay with them for 24 hours. ? ? ?SURGICAL WAITING ROOM VISITATION ?Patients having surgery or a procedure in a hospital may have two support people. ?Children under the age of 12 must have an adult with them who is not the patient. ?They may stay in the waiting area during the procedure and may switch out with other visitors. If the patient needs to stay at the hospital during part of their recovery, the visitor guidelines for inpatient rooms apply. ? ?Please refer to the [facility] website for the visitor guidelines for Inpatients (after your surgery is over and you are in a regular room).  ? ? ? ? ? ?Special instructions:   ? ?Oral Hygiene is also important to reduce your risk of infection.  Remember - BRUSH YOUR TEETH THE MORNING OF SURGERY WITH YOUR REGULAR TOOTHPASTE ? ? ?[facility]- Preparing For Surgery ? ?Before surgery, you can play an important role. Because skin is not sterile, your skin needs to be as free of germs as possible. You can reduce the number of germs on your skin by washing with CHG (chlorahexidine gluconate) Soap before surgery.  CHG is an antiseptic cleaner which kills germs and bonds with the skin to continue killing germs even after washing.   ? ? ?Please do  not use if you have an allergy to CHG or antibacterial soaps. If your skin becomes reddened/irritated stop using the CHG.  ?Do not shave (including legs and underarms) for at least 48 hours prior to first CHG shower. It is OK to shave your face. ? ?Please follow these instructions carefully. ?  ? ? Shower the NIGHT BEFORE SURGERY and the MORNING OF SURGERY with CHG Soap.  ? If you chose to wash your hair, wash your hair first as usual with your normal shampoo. After you shampoo, rinse your hair and body thoroughly  to remove the shampoo.  Then [employer] and genitals (private parts) with your normal soap and rinse thoroughly to remove soap. ? ?After that Use CHG Soap as you would any other liquid soap. You can apply CHG directly to the skin and wash gently with a scrungie or a clean washcloth.  ? ?Apply the CHG Soap to your body ONLY FROM THE NECK DOWN.  Do not use on open wounds or open sores. Avoid contact with your eyes, ears, mouth and genitals (private parts). Wash Face and genitals (private parts)  with your normal soap.  ? ?Wash thoroughly, paying special attention to the area where your surgery will be performed. ? ?Thoroughly rinse your body with warm water from the neck down. ? ?DO NOT shower/wash with your normal soap after using and rinsing off the CHG Soap. ? ?Pat yourself dry with a CLEAN TOWEL. ? ?Wear CLEAN PAJAMAS to bed the night before surgery ? ?Place CLEAN SHEETS on your bed the night before your surgery ? ?DO NOT SLEEP WITH PETS. ? ? ?Day of Surgery: ? ?Take a shower with CHG soap. ?Wear Clean/Comfortable clothing the morning of surgery ?Do not apply any deodorants/lotions.   ?Remember to brush your teeth WITH YOUR REGULAR TOOTHPASTE. ? ? ? ?If you received a COVID test during your pre-op visit, it is requested that you wear a mask when out in public, stay away from anyone that may not be feeling well, and notify your surgeon if you develop symptoms. If you have been in contact with anyone that has tested positive in the last 10 days, please notify your surgeon. ? ?  ?Please read over the following fact sheets that you were given.  ? ?

## 2022-04-19 ENCOUNTER — Encounter (HOSPITAL_COMMUNITY): Payer: Self-pay

## 2022-04-19 ENCOUNTER — Encounter (HOSPITAL_COMMUNITY)
Admission: RE | Admit: 2022-04-19 | Discharge: 2022-04-19 | Disposition: A | Discharge status: Home / Self Care | Payer: Worker's Compensation | Source: Ambulatory Visit | Attending: Orthopedic Surgery | Admitting: Orthopedic Surgery

## 2022-04-19 ENCOUNTER — Other Ambulatory Visit: Payer: Self-pay

## 2022-04-19 VITALS — BP 127/86 | HR 72 | Temp 97.9°F | Resp 17 | Ht 72.0 in | Wt 191.0 lb

## 2022-04-19 DIAGNOSIS — Z01818 Encounter for other preprocedural examination: Secondary | ICD-10-CM

## 2022-04-19 DIAGNOSIS — Z01812 Encounter for preprocedural laboratory examination: Secondary | ICD-10-CM | POA: Insufficient documentation

## 2022-04-19 HISTORY — DX: Gastro-esophageal reflux disease without esophagitis: K21.9

## 2022-04-19 HISTORY — DX: Headache, unspecified: R51.9

## 2022-04-19 LAB — SURGICAL PCR SCREEN
MRSA, PCR: NEGATIVE
Staphylococcus aureus: NEGATIVE

## 2022-04-19 NOTE — Progress Notes (Signed)
PCP - denies ?Cardiologist - denies ? ? ?Chest x-ray - n/a ?EKG - 04/07/22 (paper copy in chart) ? ?ERAS Protcol - yes, no drink given ? ?Anesthesia review: yes, per Dr. Rolena Infante' order ?Patient had EKG, CBC with diff & CMP done on 04/07/22 (paper work can be found in chart) ? ?Patient denies shortness of breath, fever, cough and chest pain at PAT appointment ? ? ?All instructions explained to the patient, with a verbal understanding of the material. Patient agrees to go over the instructions while at home for a better understanding. Patient also instructed to self quarantine after being tested for COVID-19. The opportunity to ask questions was provided. ? ? ?

## 2022-04-20 ENCOUNTER — Encounter (HOSPITAL_COMMUNITY): Payer: Self-pay | Admitting: Physician Assistant

## 2022-04-26 ENCOUNTER — Ambulatory Visit (HOSPITAL_COMMUNITY)
Admission: RE | Admit: 2022-04-26 | Payer: Worker's Compensation | Source: Home / Self Care | Admitting: Orthopedic Surgery

## 2022-04-26 SURGERY — ANTERIOR CERVICAL DECOMPRESSION/DISCECTOMY FUSION 2 LEVELS
Anesthesia: General

## 2022-06-25 ENCOUNTER — Ambulatory Visit: Payer: Self-pay | Admitting: Orthopedic Surgery

## 2022-07-06 NOTE — Progress Notes (Signed)
Surgical Instructions    Your procedure is scheduled on Thursday, July 20.  Report to [facility] Main Entrance "A" at 5:30 A.M., then check in with the Admitting office.  Call this number if you have problems the morning of surgery:  [phone]   If you have any questions prior to your surgery date call [phone]: Open Monday-Friday 8am-4pm    Remember:  Do not eat after midnight the night before your surgery  You may drink clear liquids until 4:30AM the morning of your surgery.   Clear liquids allowed are: Water, Non-Citrus Juices (without pulp), Carbonated Beverages, Clear Tea, Black Coffee ONLY (NO MILK, CREAM OR POWDERED CREAMER of any kind), and Gatorade    Take these medicines the morning of surgery with A SIP OF WATER:  gabapentin (NEURONTIN)  HYDROcodone-acetaminophen (NORCO/VICODIN) if needed   As of today, STOP taking any Aspirin (unless otherwise instructed by your surgeon) Aleve, Naproxen, Ibuprofen, Motrin, Advil, Goody's, BC's, all herbal medications, fish oil, and all vitamins.        [facility] is not responsible for any belongings or valuables. .  Do NOT Smoke (Tobacco/Vaping)  24 hours prior to your procedure  If you use a CPAP at night, you may bring your mask for your overnight stay.   Contacts, glasses, hearing aids, dentures or partials may not be worn into surgery, please bring cases for these belongings   For patients admitted to the hospital, discharge time will be determined by your treatment team.   Patients discharged the day of surgery will not be allowed to drive home, and someone needs to stay with them for 24 hours.   SURGICAL WAITING ROOM VISITATION Patients having surgery or a procedure may have no more than 2 support people in the waiting area - these visitors may rotate.   Children under the age of 69 must have an adult with them who is not the patient. If the patient needs to stay at the hospital during part of their recovery, the  visitor guidelines for inpatient rooms apply. Pre-op nurse will coordinate an appropriate time for 1 support person to accompany patient in pre-op.  This support person may not rotate.   Please refer to the [facility] website for the visitor guidelines for Inpatients (after your surgery is over and you are in a regular room).    Special instructions:    Oral Hygiene is also important to reduce your risk of infection.  Remember - BRUSH YOUR TEETH THE MORNING OF SURGERY WITH YOUR REGULAR TOOTHPASTE   [facility]- Preparing For Surgery  Before surgery, you can play an important role. Because skin is not sterile, your skin needs to be as free of germs as possible. You can reduce the number of germs on your skin by washing with CHG (chlorahexidine gluconate) Soap before surgery.  CHG is an antiseptic cleaner which kills germs and bonds with the skin to continue killing germs even after washing.     Please do not use if you have an allergy to CHG or antibacterial soaps. If your skin becomes reddened/irritated stop using the CHG.  Do not shave (including legs and underarms) for at least 48 hours prior to first CHG shower. It is OK to shave your face.  Please follow these instructions carefully.     Shower the NIGHT BEFORE SURGERY and the MORNING OF SURGERY with CHG Soap.   If you chose to wash your hair, wash your hair first as usual with your normal shampoo.  After you shampoo, rinse your hair and body thoroughly to remove the shampoo.  Then [employer] and genitals (private parts) with your normal soap and rinse thoroughly to remove soap.  After that Use CHG Soap as you would any other liquid soap. You can apply CHG directly to the skin and wash gently with a scrungie or a clean washcloth.   Apply the CHG Soap to your body ONLY FROM THE NECK DOWN.  Do not use on open wounds or open sores. Avoid contact with your eyes, ears, mouth and genitals (private parts). Wash Face and genitals (private parts)   with your normal soap.   Wash thoroughly, paying special attention to the area where your surgery will be performed.  Thoroughly rinse your body with warm water from the neck down.  DO NOT shower/wash with your normal soap after using and rinsing off the CHG Soap.  Pat yourself dry with a CLEAN TOWEL.  Wear CLEAN PAJAMAS to bed the night before surgery  Place CLEAN SHEETS on your bed the night before your surgery  DO NOT SLEEP WITH PETS.   Day of Surgery:  Take a shower with CHG soap. Wear Clean/Comfortable clothing the morning of surgery Do not apply any deodorants/lotions.   Do not wear jewelry or makeup Do not wear lotions, powders, perfumes/colognes, or deodorant. Do not shave 48 hours prior to surgery.  Men may shave face and neck. Do not bring valuables to the hospital. Do not wear nail polish, gel polish, artificial nails, or any other type of covering on natural nails (fingers and toes) If you have artificial nails or gel coating that need to be removed by a nail salon, please have this removed prior to surgery. Artificial nails or gel coating may interfere with anesthesia's ability to adequately monitor your vital signs. Remember to brush your teeth WITH YOUR REGULAR TOOTHPASTE.    If you received a COVID test during your pre-op visit, it is requested that you wear a mask when out in public, stay away from anyone that may not be feeling well, and notify your surgeon if you develop symptoms. If you have been in contact with anyone that has tested positive in the last 10 days, please notify your surgeon.    Please read over the following fact sheets that you were given.

## 2022-07-09 ENCOUNTER — Other Ambulatory Visit: Payer: Self-pay

## 2022-07-09 ENCOUNTER — Encounter (HOSPITAL_COMMUNITY)
Admission: RE | Admit: 2022-07-09 | Discharge: 2022-07-09 | Disposition: A | Discharge status: Home / Self Care | Payer: Worker's Compensation | Source: Ambulatory Visit | Attending: Orthopedic Surgery | Admitting: Orthopedic Surgery

## 2022-07-09 ENCOUNTER — Encounter (HOSPITAL_COMMUNITY): Payer: Self-pay

## 2022-07-09 VITALS — BP 134/84 | HR 69 | Temp 98.2°F | Resp 17 | Ht 71.0 in | Wt 186.2 lb

## 2022-07-09 DIAGNOSIS — Z01812 Encounter for preprocedural laboratory examination: Secondary | ICD-10-CM | POA: Insufficient documentation

## 2022-07-09 DIAGNOSIS — Z01818 Encounter for other preprocedural examination: Secondary | ICD-10-CM

## 2022-07-09 LAB — CBC
HCT: 43.5 % (ref 39.0–52.0)
Hemoglobin: 14.6 g/dL (ref 13.0–17.0)
MCH: 29.8 pg (ref 26.0–34.0)
MCHC: 33.6 g/dL (ref 30.0–36.0)
MCV: 88.8 fL (ref 80.0–100.0)
Platelets: 238 10*3/uL (ref 150–400)
RBC: 4.9 MIL/uL (ref 4.22–5.81)
RDW: 13.8 % (ref 11.5–15.5)
WBC: 4.8 10*3/uL (ref 4.0–10.5)
nRBC: 0 % (ref 0.0–0.2)

## 2022-07-09 LAB — SURGICAL PCR SCREEN
MRSA, PCR: NEGATIVE
Staphylococcus aureus: NEGATIVE

## 2022-07-09 NOTE — Progress Notes (Signed)
PCP - denies Cardiologist - denies  PPM/ICD - denies   Chest x-ray - 11/13/06 EKG - 04/07/22 Stress Test - denies ECHO - denies Cardiac Cath - denies  Sleep Study - denies   DM- denies  ASA/Blood Thinner Instructions: n/a   ERAS Protcol - yes, no drink  COVID TEST- n/a   Anesthesia review: no  Patient denies shortness of breath, fever, cough and chest pain at PAT appointment   All instructions explained to the patient, with a verbal understanding of the material. Patient agrees to go over the instructions while at home for a better understanding. The opportunity to ask questions was provided.

## 2022-07-11 NOTE — Anesthesia Preprocedure Evaluation (Addendum)
Anesthesia Evaluation  Patient identified by MRN, date of birth, ID band Patient awake    Reviewed: Allergy & Precautions, NPO status , Patient's Chart, lab work & pertinent test results  Airway Mallampati: II  TM Distance: >3 FB Neck ROM: Full    Dental  (+) Dental Advisory Given, Chipped,    Pulmonary neg pulmonary ROS, former smoker,    Pulmonary exam normal breath sounds clear to auscultation       Cardiovascular negative cardio ROS Normal cardiovascular exam Rhythm:Regular Rate:Normal     Neuro/Psych  Headaches, negative psych ROS   GI/Hepatic Neg liver ROS, GERD  ,  Endo/Other  negative endocrine ROS  Renal/GU negative Renal ROS  negative genitourinary   Musculoskeletal negative musculoskeletal ROS (+)   Abdominal   Peds  Hematology negative hematology ROS (+)   Anesthesia Other Findings 2-3 norco 5/325mg  QD  Reproductive/Obstetrics                            Anesthesia Physical Anesthesia Plan  ASA: 2  Anesthesia Plan: General   Post-op Pain Management: Tylenol PO (pre-op)* and Ketamine IV*   Induction: Intravenous  PONV Risk Score and Plan: 2 and Midazolam, Dexamethasone and Ondansetron  Airway Management Planned: Oral ETT and Video Laryngoscope Planned  Additional Equipment:   Intra-op Plan:   Post-operative Plan: Extubation in OR  Informed Consent: I have reviewed the patients History and Physical, chart, labs and discussed the procedure including the risks, benefits and alternatives for the proposed anesthesia with the patient or authorized representative who has indicated his/her understanding and acceptance.     Dental advisory given  Plan Discussed with: CRNA  Anesthesia Plan Comments:        Anesthesia Quick Evaluation

## 2022-07-12 ENCOUNTER — Ambulatory Visit (HOSPITAL_COMMUNITY): Payer: Worker's Compensation | Admitting: Emergency Medicine

## 2022-07-12 ENCOUNTER — Other Ambulatory Visit: Payer: Self-pay

## 2022-07-12 ENCOUNTER — Encounter (HOSPITAL_COMMUNITY): Payer: Self-pay | Admitting: Orthopedic Surgery

## 2022-07-12 ENCOUNTER — Observation Stay (HOSPITAL_COMMUNITY)
Admission: RE | Admit: 2022-07-12 | Discharge: 2022-07-12 | Disposition: A | Discharge status: Home / Self Care | Payer: Worker's Compensation | Attending: Orthopedic Surgery | Admitting: Orthopedic Surgery

## 2022-07-12 ENCOUNTER — Ambulatory Visit (HOSPITAL_COMMUNITY): Payer: Medicaid Other

## 2022-07-12 ENCOUNTER — Encounter (HOSPITAL_COMMUNITY)
Admission: RE | Disposition: A | Discharge status: Home / Self Care | Payer: Self-pay | Source: Home / Self Care | Attending: Orthopedic Surgery

## 2022-07-12 ENCOUNTER — Ambulatory Visit (HOSPITAL_BASED_OUTPATIENT_CLINIC_OR_DEPARTMENT_OTHER): Payer: Worker's Compensation | Admitting: Certified Registered Nurse Anesthetist

## 2022-07-12 DIAGNOSIS — M50123 Cervical disc disorder at C6-C7 level with radiculopathy: Secondary | ICD-10-CM | POA: Diagnosis not present

## 2022-07-12 DIAGNOSIS — M4722 Other spondylosis with radiculopathy, cervical region: Secondary | ICD-10-CM | POA: Diagnosis present

## 2022-07-12 DIAGNOSIS — Z79899 Other long term (current) drug therapy: Secondary | ICD-10-CM | POA: Insufficient documentation

## 2022-07-12 DIAGNOSIS — M5020 Other cervical disc displacement, unspecified cervical region: Secondary | ICD-10-CM | POA: Diagnosis present

## 2022-07-12 HISTORY — PX: ANTERIOR CERVICAL DECOMP/DISCECTOMY FUSION: SHX1161

## 2022-07-12 SURGERY — ANTERIOR CERVICAL DECOMPRESSION/DISCECTOMY FUSION 2 LEVELS
Anesthesia: General | Site: Spine Cervical

## 2022-07-12 MED ORDER — MENTHOL 3 MG MT LOZG: 1.0000 | LOZENGE | OROMUCOSAL | Status: DC | PRN

## 2022-07-12 MED ORDER — ONDANSETRON HCL 4 MG PO TABS: 4.0000 mg | ORAL_TABLET | Freq: Four times a day (QID) | ORAL | Status: DC | PRN

## 2022-07-12 MED ORDER — FENTANYL CITRATE (PF) 100 MCG/2ML IJ SOLN
25.0000 ug | INTRAMUSCULAR | Status: DC | PRN
  Administered 2022-07-12 (×2): 50 ug via INTRAVENOUS

## 2022-07-12 MED ORDER — GABAPENTIN 300 MG PO CAPS
300.0000 mg | ORAL_CAPSULE | Freq: Three times a day (TID) | ORAL | Status: DC
  Administered 2022-07-12: 300 mg via ORAL
  Filled 2022-07-12: qty 1

## 2022-07-12 MED ORDER — KETAMINE HCL 50 MG/5ML IJ SOSY
PREFILLED_SYRINGE | INTRAMUSCULAR | Status: AC
  Filled 2022-07-12: qty 5

## 2022-07-12 MED ORDER — MIDAZOLAM HCL 2 MG/2ML IJ SOLN
INTRAMUSCULAR | Status: AC
  Filled 2022-07-12: qty 2

## 2022-07-12 MED ORDER — ACETAMINOPHEN 325 MG PO TABS
ORAL_TABLET | ORAL | Status: AC
  Administered 2022-07-12: 650 mg via ORAL
  Filled 2022-07-12: qty 2

## 2022-07-12 MED ORDER — OXYCODONE HCL 5 MG PO TABS
10.0000 mg | ORAL_TABLET | ORAL | Status: DC | PRN
  Administered 2022-07-12 (×2): 10 mg via ORAL
  Filled 2022-07-12 (×2): qty 2

## 2022-07-12 MED ORDER — PROPOFOL 10 MG/ML IV BOLUS
INTRAVENOUS | Status: AC
  Filled 2022-07-12: qty 20

## 2022-07-12 MED ORDER — KETAMINE HCL 10 MG/ML IJ SOLN
INTRAMUSCULAR | Status: DC | PRN
  Administered 2022-07-12: 10 mg via INTRAVENOUS
  Administered 2022-07-12: 30 mg via INTRAVENOUS
  Administered 2022-07-12: 10 mg via INTRAVENOUS

## 2022-07-12 MED ORDER — LACTATED RINGERS IV SOLN: INTRAVENOUS | Status: DC

## 2022-07-12 MED ORDER — BUPIVACAINE-EPINEPHRINE 0.25% -1:200000 IJ SOLN
INTRAMUSCULAR | Status: DC | PRN
  Administered 2022-07-12: 6 mL

## 2022-07-12 MED ORDER — HYDROMORPHONE HCL 1 MG/ML IJ SOLN: 0.5000 mg | INTRAMUSCULAR | Status: DC | PRN

## 2022-07-12 MED ORDER — PHENOL 1.4 % MT LIQD: 1.0000 | OROMUCOSAL | Status: DC | PRN

## 2022-07-12 MED ORDER — SURGIFLO WITH THROMBIN (HEMOSTATIC MATRIX KIT) OPTIME
TOPICAL | Status: DC | PRN
  Administered 2022-07-12: 1 via TOPICAL

## 2022-07-12 MED ORDER — POLYETHYLENE GLYCOL 3350 17 G PO PACK: 17.0000 g | PACK | Freq: Every day | ORAL | Status: DC | PRN

## 2022-07-12 MED ORDER — PHENYLEPHRINE HCL (PRESSORS) 10 MG/ML IV SOLN
INTRAVENOUS | Status: DC | PRN
  Administered 2022-07-12 (×4): 80 ug via INTRAVENOUS

## 2022-07-12 MED ORDER — SODIUM CHLORIDE 0.9 % IV SOLN: INTRAVENOUS | Status: DC

## 2022-07-12 MED ORDER — BUPIVACAINE-EPINEPHRINE (PF) 0.25% -1:200000 IJ SOLN
INTRAMUSCULAR | Status: AC
  Filled 2022-07-12: qty 30

## 2022-07-12 MED ORDER — METHOCARBAMOL 1000 MG/10ML IJ SOLN: 500.0000 mg | Freq: Four times a day (QID) | INTRAVENOUS | Status: DC | PRN

## 2022-07-12 MED ORDER — OXYCODONE HCL 5 MG PO TABS: 5.0000 mg | ORAL_TABLET | ORAL | Status: DC | PRN

## 2022-07-12 MED ORDER — LIDOCAINE 2% (20 MG/ML) 5 ML SYRINGE
INTRAMUSCULAR | Status: DC | PRN
  Administered 2022-07-12: 60 mg via INTRAVENOUS

## 2022-07-12 MED ORDER — ROCURONIUM BROMIDE 10 MG/ML (PF) SYRINGE
PREFILLED_SYRINGE | INTRAVENOUS | Status: DC | PRN
  Administered 2022-07-12: 10 mg via INTRAVENOUS
  Administered 2022-07-12: 20 mg via INTRAVENOUS
  Administered 2022-07-12: 80 mg via INTRAVENOUS
  Administered 2022-07-12: 10 mg via INTRAVENOUS

## 2022-07-12 MED ORDER — PHENYLEPHRINE 80 MCG/ML (10ML) SYRINGE FOR IV PUSH (FOR BLOOD PRESSURE SUPPORT)
PREFILLED_SYRINGE | INTRAVENOUS | Status: DC | PRN
  Administered 2022-07-12: 160 ug via INTRAVENOUS

## 2022-07-12 MED ORDER — OXYCODONE-ACETAMINOPHEN 10-325 MG PO TABS: 1.0000 | ORAL_TABLET | Freq: Four times a day (QID) | ORAL | 0 refills | Status: AC | PRN

## 2022-07-12 MED ORDER — ACETAMINOPHEN 650 MG RE SUPP: 650.0000 mg | RECTAL | Status: DC | PRN

## 2022-07-12 MED ORDER — METHOCARBAMOL 500 MG PO TABS
500.0000 mg | ORAL_TABLET | Freq: Four times a day (QID) | ORAL | Status: DC | PRN
  Administered 2022-07-12: 500 mg via ORAL
  Filled 2022-07-12: qty 1

## 2022-07-12 MED ORDER — CHLORHEXIDINE GLUCONATE 0.12 % MT SOLN: 15.0000 mL | Freq: Once | OROMUCOSAL | Status: AC

## 2022-07-12 MED ORDER — FLEET ENEMA 7-19 GM/118ML RE ENEM: 1.0000 | ENEMA | Freq: Once | RECTAL | Status: DC | PRN

## 2022-07-12 MED ORDER — DEXAMETHASONE SODIUM PHOSPHATE 4 MG/ML IJ SOLN: 4.0000 mg | Freq: Four times a day (QID) | INTRAMUSCULAR | Status: DC

## 2022-07-12 MED ORDER — FENTANYL CITRATE (PF) 250 MCG/5ML IJ SOLN
INTRAMUSCULAR | Status: DC | PRN
  Administered 2022-07-12 (×2): 50 ug via INTRAVENOUS
  Administered 2022-07-12: 100 ug via INTRAVENOUS

## 2022-07-12 MED ORDER — ONDANSETRON HCL 4 MG PO TABS: 4.0000 mg | ORAL_TABLET | Freq: Three times a day (TID) | ORAL | 0 refills | Status: AC | PRN

## 2022-07-12 MED ORDER — ACETAMINOPHEN 325 MG PO TABS: 650.0000 mg | ORAL_TABLET | ORAL | Status: DC | PRN

## 2022-07-12 MED ORDER — FENTANYL CITRATE (PF) 250 MCG/5ML IJ SOLN
INTRAMUSCULAR | Status: AC
  Filled 2022-07-12: qty 5

## 2022-07-12 MED ORDER — DEXAMETHASONE 4 MG PO TABS
4.0000 mg | ORAL_TABLET | Freq: Four times a day (QID) | ORAL | Status: DC
  Administered 2022-07-12 (×2): 4 mg via ORAL
  Filled 2022-07-12 (×2): qty 1

## 2022-07-12 MED ORDER — ACETAMINOPHEN 325 MG PO TABS: 650.0000 mg | ORAL_TABLET | Freq: Once | ORAL | Status: AC

## 2022-07-12 MED ORDER — THROMBIN 20000 UNITS EX SOLR
CUTANEOUS | Status: AC
Start: 2022-07-12 — End: ?
  Filled 2022-07-12: qty 20000

## 2022-07-12 MED ORDER — MIDAZOLAM HCL 2 MG/2ML IJ SOLN
INTRAMUSCULAR | Status: DC | PRN
  Administered 2022-07-12: 2 mg via INTRAVENOUS

## 2022-07-12 MED ORDER — ONDANSETRON HCL 4 MG/2ML IJ SOLN
INTRAMUSCULAR | Status: DC | PRN
Start: 2022-07-12 — End: 2022-07-12
  Administered 2022-07-12: 4 mg via INTRAVENOUS

## 2022-07-12 MED ORDER — THROMBIN 20000 UNITS EX SOLR: CUTANEOUS | Status: DC | PRN

## 2022-07-12 MED ORDER — 0.9 % SODIUM CHLORIDE (POUR BTL) OPTIME
TOPICAL | Status: DC | PRN
  Administered 2022-07-12: 1000 mL

## 2022-07-12 MED ORDER — METHOCARBAMOL 500 MG PO TABS: 500.0000 mg | ORAL_TABLET | Freq: Three times a day (TID) | ORAL | 0 refills | Status: AC | PRN

## 2022-07-12 MED ORDER — PROPOFOL 10 MG/ML IV BOLUS
INTRAVENOUS | Status: DC | PRN
  Administered 2022-07-12: 150 mg via INTRAVENOUS

## 2022-07-12 MED ORDER — CHLORHEXIDINE GLUCONATE 0.12 % MT SOLN
OROMUCOSAL | Status: AC
  Administered 2022-07-12: 15 mL via OROMUCOSAL
  Filled 2022-07-12: qty 15

## 2022-07-12 MED ORDER — VANCOMYCIN HCL IN DEXTROSE 1-5 GM/200ML-% IV SOLN: 1000.0000 mg | Freq: Once | INTRAVENOUS | Status: DC

## 2022-07-12 MED ORDER — ACETAMINOPHEN 500 MG PO TABS
ORAL_TABLET | ORAL | Status: AC
  Filled 2022-07-12: qty 2

## 2022-07-12 MED ORDER — FENTANYL CITRATE (PF) 100 MCG/2ML IJ SOLN
INTRAMUSCULAR | Status: AC
  Filled 2022-07-12: qty 2

## 2022-07-12 MED ORDER — DEXAMETHASONE SODIUM PHOSPHATE 10 MG/ML IJ SOLN
INTRAMUSCULAR | Status: DC | PRN
  Administered 2022-07-12: 10 mg via INTRAVENOUS

## 2022-07-12 MED ORDER — VANCOMYCIN HCL 1500 MG/300ML IV SOLN
1500.0000 mg | INTRAVENOUS | Status: AC
  Administered 2022-07-12: 1500 mg via INTRAVENOUS
  Filled 2022-07-12: qty 300

## 2022-07-12 MED ORDER — SODIUM CHLORIDE 0.9% FLUSH
3.0000 mL | Freq: Two times a day (BID) | INTRAVENOUS | Status: DC
  Administered 2022-07-12: 3 mL via INTRAVENOUS

## 2022-07-12 MED ORDER — SODIUM CHLORIDE 0.9% FLUSH: 3.0000 mL | INTRAVENOUS | Status: DC | PRN

## 2022-07-12 MED ORDER — ACETAMINOPHEN 500 MG PO TABS: 1000.0000 mg | ORAL_TABLET | Freq: Once | ORAL | Status: DC

## 2022-07-12 MED ORDER — ORAL CARE MOUTH RINSE: 15.0000 mL | Freq: Once | OROMUCOSAL | Status: AC

## 2022-07-12 MED ORDER — ONDANSETRON HCL 4 MG/2ML IJ SOLN: 4.0000 mg | Freq: Four times a day (QID) | INTRAMUSCULAR | Status: DC | PRN

## 2022-07-12 SURGICAL SUPPLY — 68 items
AGENT HMST KT MTR STRL THRMB (HEMOSTASIS) ×1
BAG COUNTER SPONGE SURGICOUNT (BAG) ×2 IMPLANT
BLADE CLIPPER SURG (BLADE) ×1 IMPLANT
BUR EGG ELITE 4.0 (BURR) IMPLANT
BUR MATCHSTICK NEURO 3.0 LAGG (BURR) IMPLANT
CABLE BIPOLOR RESECTION CORD (MISCELLANEOUS) ×2 IMPLANT
CANISTER SUCT 3000ML PPV (MISCELLANEOUS) ×2 IMPLANT
CLSR STERI-STRIP ANTIMIC 1/2X4 (GAUZE/BANDAGES/DRESSINGS) ×2 IMPLANT
COVER MAYO STAND STRL (DRAPES) ×6 IMPLANT
COVER SURGICAL LIGHT HANDLE (MISCELLANEOUS) ×3 IMPLANT
DEVICE EDNSKLTN TC NNLCK MED 8 (Cage) IMPLANT
DRAIN CHANNEL 15F RND FF W/TCR (WOUND CARE) IMPLANT
DRAPE C-ARM 42X72 X-RAY (DRAPES) ×2 IMPLANT
DRAPE POUCH INSTRU U-SHP 10X18 (DRAPES) ×2 IMPLANT
DRAPE SURG 17X23 STRL (DRAPES) ×2 IMPLANT
DRAPE U-SHAPE 47X51 STRL (DRAPES) ×2 IMPLANT
DRSG OPSITE POSTOP 3X4 (GAUZE/BANDAGES/DRESSINGS) ×1 IMPLANT
DRSG OPSITE POSTOP 4X6 (GAUZE/BANDAGES/DRESSINGS) ×1 IMPLANT
DURAPREP 26ML APPLICATOR (WOUND CARE) ×2 IMPLANT
ELECT COATED BLADE 2.86 ST (ELECTRODE) ×2 IMPLANT
ELECT PENCIL ROCKER SW 15FT (MISCELLANEOUS) ×2 IMPLANT
ELECT REM PT RETURN 9FT ADLT (ELECTROSURGICAL) ×2
ELECTRODE REM PT RTRN 9FT ADLT (ELECTROSURGICAL) ×1 IMPLANT
ENDOSKELTON IMPLANT TC MED 8MM (Cage) ×4 IMPLANT
GLOVE BIO SURGEON STRL SZ 6.5 (GLOVE) ×2 IMPLANT
GLOVE BIOGEL PI IND STRL 6.5 (GLOVE) ×1 IMPLANT
GLOVE BIOGEL PI IND STRL 8.5 (GLOVE) ×1 IMPLANT
GLOVE BIOGEL PI INDICATOR 6.5 (GLOVE) ×1
GLOVE BIOGEL PI INDICATOR 8.5 (GLOVE) ×1
GLOVE SS BIOGEL STRL SZ 8.5 (GLOVE) ×1 IMPLANT
GLOVE SUPERSENSE BIOGEL SZ 8.5 (GLOVE) ×1
GOWN STRL REUS W/ TWL LRG LVL3 (GOWN DISPOSABLE) ×1 IMPLANT
GOWN STRL REUS W/TWL 2XL LVL3 (GOWN DISPOSABLE) ×2 IMPLANT
GOWN STRL REUS W/TWL LRG LVL3 (GOWN DISPOSABLE) ×2
KIT BASIN OR (CUSTOM PROCEDURE TRAY) ×2 IMPLANT
KIT TURNOVER KIT B (KITS) ×2 IMPLANT
NDL SPNL 18GX3.5 QUINCKE PK (NEEDLE) ×1 IMPLANT
NEEDLE HYPO 22GX1.5 SAFETY (NEEDLE) ×2 IMPLANT
NEEDLE SPNL 18GX3.5 QUINCKE PK (NEEDLE) ×2 IMPLANT
NS IRRIG 1000ML POUR BTL (IV SOLUTION) ×2 IMPLANT
PACK ORTHO CERVICAL (CUSTOM PROCEDURE TRAY) ×2 IMPLANT
PACK UNIVERSAL I (CUSTOM PROCEDURE TRAY) ×2 IMPLANT
PAD ARMBOARD 7.5X6 YLW CONV (MISCELLANEOUS) ×4 IMPLANT
PATTIES SURGICAL .25X.25 (GAUZE/BANDAGES/DRESSINGS) ×1 IMPLANT
PIN DISTRACTION MAXCESS-C 14 (PIN) ×2 IMPLANT
PLATE ACP 1.6X40 2LVL (Plate) ×1 IMPLANT
POSITIONER HEAD DONUT 9IN (MISCELLANEOUS) ×2 IMPLANT
PUTTY BONE DBX 5CC MIX (Putty) ×1 IMPLANT
RESTRAINT LIMB HOLDER UNIV (RESTRAINTS) ×2 IMPLANT
SCREW ACP VA SD 3.5X15 (Screw) ×3 IMPLANT
SCREW ACP VA ST 3.5X15 (Screw) ×2 IMPLANT
SCREW ACP VA ST 4X13 (Screw) ×1 IMPLANT
SPONGE INTESTINAL PEANUT (DISPOSABLE) ×2 IMPLANT
SPONGE SURGIFOAM ABS GEL 100 (HEMOSTASIS) ×2 IMPLANT
SPONGE T-LAP 4X18 ~~LOC~~+RFID (SPONGE) ×4 IMPLANT
SURGIFLO W/THROMBIN 8M KIT (HEMOSTASIS) ×1 IMPLANT
SUT BONE WAX W31G (SUTURE) ×2 IMPLANT
SUT MNCRL AB 3-0 PS2 27 (SUTURE) ×2 IMPLANT
SUT SILK 2 0 (SUTURE)
SUT SILK 2-0 18XBRD TIE 12 (SUTURE) IMPLANT
SUT VIC AB 2-0 CT1 18 (SUTURE) ×2 IMPLANT
SYR BULB IRRIG 60ML STRL (SYRINGE) ×2 IMPLANT
SYR CONTROL 10ML LL (SYRINGE) ×2 IMPLANT
TAPE CLOTH 4X10 WHT NS (GAUZE/BANDAGES/DRESSINGS) ×2 IMPLANT
TAPE UMBILICAL COTTON 1/8X30 (MISCELLANEOUS) ×2 IMPLANT
TOWEL GREEN STERILE (TOWEL DISPOSABLE) ×2 IMPLANT
TOWEL GREEN STERILE FF (TOWEL DISPOSABLE) ×2 IMPLANT
WATER STERILE IRR 1000ML POUR (IV SOLUTION) ×2 IMPLANT

## 2022-07-12 NOTE — Progress Notes (Signed)
Patient walked out of the unit to his vehicle accompanied by RN for discharge home; moves all extremities well; in no acute distress nor complaints of pain nor discomfort; incision on his anterior neck with honeycomb dressing and is clean, dry and intact; room was checked for all his belongings and was taken by fiance to her vehicle; discharge instructions concerning his medications, incision care, follow up appointment and when to call the doctor as needed were all discussed with patient and his girlfriend by RN and both expressed understanding on the instructions given.

## 2022-07-12 NOTE — Progress Notes (Signed)
Pharmacy Antibiotic Note  [name] is a 46 y.o. male admitted on 07/12/2022 with surgical prophylaxis.  Pharmacy has been consulted for vancomycin dosing.  No drain per RN. Received 1500 mg vancomycin preop. 8/12 Scr 1.26 = ClCr  100 ml/min.   Plan: Vancomycin 1000mg  x1   Height: 5\' 11"  (180.3 cm) Weight: 83.9 kg (185 lb) IBW/kg (Calculated) : 75.3  Temp (24hrs), Avg:98.1 F (36.7 C), Min:97.8 F (36.6 C), Max:98.2 F (36.8 C)  Recent Labs  Lab 07/09/22 1140  WBC 4.8    CrCl cannot be calculated (No successful lab value found.).    Allergies  Allergen Reactions   Amoxicillin Hives and Itching    Above 500 mg      Thank you for allowing pharmacy to be a part of this patient's care.  , PharmD, BCPS, BCCP Clinical Pharmacist  Please check AMION for all [facility] phone numbers After 10:00 PM, call Main Pharmacy [phone]

## 2022-07-12 NOTE — Discharge Instructions (Signed)

## 2022-07-12 NOTE — Brief Op Note (Signed)
07/12/2022  11:10 AM  PATIENT:  Phillip Wiley  46 y.o. 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

## 2022-07-12 NOTE — H&P (Addendum)
History: Phillip Wiley presents today with ongoing severe neck and radicular left arm pain. Clinical exam is essentially unchanged. Despite appropriate conservative management his quality of life continues to deteriorate. we will plan on moving forward with his two-level fusion for his cervical spondylitic radiculopathy    Past Medical History:  Diagnosis Date   GERD (gastroesophageal reflux disease)    Headache     Allergies  Allergen Reactions   Amoxicillin Hives and Itching    Above 500 mg    No current facility-administered medications on file prior to encounter.   Current Outpatient Medications on File Prior to Encounter  Medication Sig Dispense Refill   CINNAMON PO Take 1,200 mg by mouth daily.     gabapentin (NEURONTIN) 300 MG capsule Take 300 mg by mouth 3 (three) times daily.     HYDROcodone-acetaminophen (NORCO/VICODIN) 5-325 MG tablet Take 1 tablet by mouth every 6 (six) hours as needed for severe pain or moderate pain.     OVER THE COUNTER MEDICATION Take 500 mg by mouth daily. Chromium      Physical Exam: Vitals:   07/12/22 0606  BP: 125/79  Pulse: 63  Resp: 17  Temp: 98.1 F (36.7 C)  SpO2: 96%   Body mass index is 25.8 kg/m. Clinical exam: Phillip Wiley is a pleasant individual, who appears younger than their stated age.  He is alert and orientated 3.  No shortness of breath, chest pain. Lungs: Clear to auscultation bilaterally  Cardiac: Regular rate and rhythm no rubs gallops or murmurs.  Abdomen is soft and non-tender, negative loss of bowel and bladder control, no rebound tenderness.  Negative: skin lesions abrasions contusions  Peripheral pulses: 2+ radial artery pulses bilaterally. LE compartments are: Soft and nontender.  Gait pattern: Normal  Assistive devices: None  Neuro: 4/5 left bicep, wrist extensor, and tricep strength. 5/5 motor strength in the right upper extremity. Negative Hoffman test. Positive Spurling sign with reproduction  of left radicular arm pain. Negative Hoffman test, no clonus, 1+ deep tendon reflexes symmetrical in the upper extremity.  Musculoskeletal: significant neck pain with palpation and range of motion. Positive crepitus, no occipital headaches.  Cervical x-rays demonstrate normal sagittal alignment. No collapse of the disc spaces. No spondylolisthesis.  Cervical MRI: completed on 04/25/2022: No cord signal changes. No fracture or abnormal marrow signal change. Moderate degenerative disease at C3-4 by foraminal disc osteophyte complexes left greater than right. Mild central stenosis without foraminal stenosis at C4-5. Moderate left foraminal stenosis at C5-6, severe right and moderate to severe left foraminal stenosis at C6-7. The stenosis is directly affecting the exiting C6 and C7 nerve roots. No cord signal changes. No fracture or abnormal marrow signal changes. No significant change from prior MRI of 10/06/2021.    A/P: Summary: Phillip Wiley presents today with significant neck and radicular arm pain with neurological deficits. Despite appropriate conservative management his quality of life continues to deteriorate. At this point having obtained work [employer] we are finally moving forward with a two-level TDR. Patient states their pain level is severe. The pain does interfere with activities of daily living as well as overall quality of life.  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 Surgical plan is two-level ACDF C5-7.  Risks and benefits of surgery were discussed with the patient. These include: Infection, bleeding, death, stroke, paralysis, ongoing or worse pain, need for additional surgery, nonunion, leak of spinal fluid, adjacent segment degeneration requiring additional fusion surgery.  Pseudoarthrosis (nonunion)requiring supplemental posterior fixation. Throat pain, swallowing difficulties, hoarseness or change in voice. Heterotopic  ossification, inability to place the disc due to technical issues requiring bailout to a fusion procedure.  All the patient's questions and concerns were addressed. I did share with him that I am cautiously optimistic about his recovery specifically due to the duration of his symptoms. Overall though I do think he will do well and his quality of life will be restored.  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

## 2022-07-12 NOTE — Op Note (Signed)
OPERATIVE REPORT  DATE OF SURGERY: 07/12/2022  PATIENT NAME:  Phillip Wiley MRN: [id number] DOB: [birth year]  PCP: Patient, No Pcp Per  PRE-OPERATIVE DIAGNOSIS: Cervical spondylitic radiculopathy with right arm weakness.  C6 and C7 nerve root compression   POST-OPERATIVE DIAGNOSIS: Same  PROCEDURE:   ACDF C5-7  SURGEON:  Venita Lick, MD  PHYSICIAN ASSISTANT: None  ANESTHESIA:   General  EBL: Less than 50 ml   Complications: None  Implants: Titan intervertebral cage.  8 mm medium lordotic cages.  NuVasive ACP plate: 40 mm length fixed with 15 mm locking screws at C5 and C6, and 13 mm rescue screws at C7  Graft: DBX mix  BRIEF HISTORY: Phillip Wiley is a 46 y.o. male who presented my office with significant neck and radicular arm pain.  He was noted to have right upper extremity weakness of the bicep and tricep consistent with C6 and C7 radiculopathy.  In addition patient MRI which showed disc herniation causing foraminal stenosis on the right side affecting the exiting C6 and C7 nerve root.  After protracted conservative treatment patient ultimately elected to move forward with surgery.  We have discussed total disc arthroplasty versus ACDF and ultimately patient decided to have a fusion.  All appropriate risks, benefits, and alternatives to surgery were discussed and consent was  PROCEDURE DETAILS: Patient was brought into the operating room and was properly positioned on the operating room table.  After induction with general anesthesia the patient was endotracheally intubated.  A timeout was taken to confirm all important data: including patient, procedure, and the level. Teds, SCD's were applied.   The anterior cervical spine was prepped and draped in standard fashion.  Using fluoroscopy identified the C6 vertebral body and marked out a transverse incision over this area.  I infiltrated the incision with quarter percent Marcaine with epinephrine and made a transverse  incision.  Sharp dissection was carried out down to and through the platysma.  I then dissected along the medial border the sternocleidomastoid entering into the deep cervical fascia.  The omohyoid was encountered and mobilized.  I continued to dissect through the deep cervical and ultimately prevertebral fascia until I was at the anterior cervical spine.  This was a standard Smith-Robinson approach to the cervical spine.  I then placed a hand-held retractor to protect the trachea and esophagus and then used Kitner dissectors to continue mobilizing the remainder of the prevertebral fascia and adequately exposed the C5-6 and C6-7 disc spaces.  A needle was then placed at the 5 6 disc space and a lateral fluoroscopy view confirmed that I was at the appropriate level the disc space was then marked with the Bovie.  Using bipolar cautery I mobilized the longus coli muscles bilaterally from the superior aspect of C5 to the inferior aspect of C7.  Self-retaining Caspar retractor blades were placed into the wound and the endotracheal cuff was deflated and the retractors were expanded.  The endotracheal cuff was then reinflated.  An annulotomy was performed with a 15 blade scalpel and I removed all of the disc material at the C6-7 disc space level.  I then used curettes to remove the remaining cartilaginous endplate and continue to dissect posteriorly.  I then used a fine nerve hook to dissect through the posterior annulus as well as the posterior longitudinal ligament.  Using a 1 mm Kerrison rongeur I then resected the posterior longitudinal ligament.  A disc fragment was then removed from the right posterior lateral corner consistent with  what was seen on the preoperative MRI.  I was now able to get underneath the uncovertebral joint and adequately decompressed the nerve root.  At this point I could freely pass my nerve hook behind the vertebral body and under the uncovertebral joints.  I was quite pleased with the  overall decompression.  I then used a rasp to obtain bleeding subchondral bone and then trialed the intervertebral spacers.  I elected to use the 8 mm medium lordotic cage.  The cage was obtained and packed with the graft and malleted to the appropriate depth.  At this point I then relocated my retractors to expose the C5-6 disc space and using the exact same technique I used at C6-7 I performed a C5-6 discectomy.  I again mobilized the posterior annulus and posterior longitudinal ligament and remove them with a 1 mm Kerrison rongeur this allowed me to lyse and remove 3 fragments of disc material that were central and slightly to the right.  Again this is what was consistent with what was seen on the preoperative MRI.  I again decompressed under the uncovertebral joints to further decompress the nerve root.  Trial and rest the endplates and placed the 8 mm lordotic cage packed with DBX mix.  Imaging at this point demonstrate satisfactory position of the intervertebral cages.  I irrigated copiously normal saline and then contoured a 40 mm plate and secured it to the vertebral body of C5 and C7 with 15 mm locking screws.  I felt as though the 15 might be a little to long at C7 and so I removed this and replaced it with a 13 mm rescue screws.  I then placed a 15 mm locking screws at the body of C6.  All 6 screws had excellent purchase.  Imaging demonstrated satisfactory overall position and alignment of the implant and the cage.  I irrigated copiously with normal saline and made sure hemostasis using bipolar electrocautery.  I then removed the Caspar retractor and then returned the trach and esophagus to midline.  The platysma was then closed with interrupted 2-0 Vicryl suture and the skin with a 3-0 Monocryl.  Steri-Strips and a dry dressing as well as the Aspen collar were applied.  Imaging demonstrated satisfactory position of the implants in both the AP and lateral planes.  Patient was ultimately extubated and  transferred the PACU without incident.  Venita Lick, MD 07/12/2022 11:00 AM

## 2022-07-12 NOTE — Anesthesia Procedure Notes (Signed)
Procedure Name: Intubation Date/Time: 07/12/2022 7:49 AM  Performed by: Jodell Cipro, CRNAPre-anesthesia Checklist: Patient identified, Emergency Drugs available, Suction available and Patient being monitored Patient Re-evaluated:Patient Re-evaluated prior to induction Oxygen Delivery Method: Circle System Utilized Preoxygenation: Pre-oxygenation with 100% oxygen Induction Type: IV induction Ventilation: Mask ventilation without difficulty and Oral airway inserted - appropriate to patient size Laryngoscope Size: Glidescope and 4 Tube type: Oral Number of attempts: 1 Airway Equipment and Method: Oral airway, Rigid stylet and Video-laryngoscopy Placement Confirmation: ETT inserted through vocal cords under direct vision, positive ETCO2 and breath sounds checked- equal and bilateral Secured at: 21 cm Tube secured with: Tape Dental Injury: Teeth and Oropharynx as per pre-operative assessment

## 2022-07-12 NOTE — Transfer of Care (Signed)
Immediate Anesthesia Transfer of Care Note  Patient: Phillip Wiley  Procedure(s) Performed: ANTERIOR CERVICAL DISCECTOMY AND FUSION CERVICAL FIVE THROUGH SEVEN (Spine Cervical)  Patient Location: PACU  Anesthesia Type:General  Level of Consciousness: drowsy and patient cooperative  Airway & Oxygen Therapy: Patient Spontanous Breathing and Patient connected to nasal cannula oxygen  Post-op Assessment: Report given to RN, Post -op Vital signs reviewed and stable and Patient moving all extremities  Post vital signs: Reviewed and stable  Last Vitals:  Vitals Value Taken Time  BP 122/64 07/12/22 1115  Temp 36.8 C 07/12/22 1115  Pulse 101 07/12/22 1117  Resp 18 07/12/22 1117  SpO2 93 % 07/12/22 1117  Vitals shown include unvalidated device data.  Last Pain:  Vitals:   07/12/22 0630  TempSrc:   PainSc: 6          Complications: No notable events documented.

## 2022-07-12 NOTE — Evaluation (Signed)
Occupational Therapy Evaluation Patient Details Name: Phillip Wiley MRN: [id number] DOB: Jul 11, 1976 Today's Date: 07/12/2022   History of Present Illness 46 yo M s/p ACDF.  PMH unremarkable.   Clinical Impression   Patient admitted for the procedure above.  PTA he lives with his spouse, daughter and pet.  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  Patient has a good understanding of all precautions, able to manage his cervical collar, setup for ADL completion, and is Independent with mobility in the hall and stairs.  No further needs in the acute setting, recommend follow up with MD as prescribed.        Recommendations for follow up therapy are one component of a multi-disciplinary discharge planning process, led by the attending physician.  Recommendations may be updated based on patient status, additional functional criteria and insurance authorization.   Follow Up Recommendations  No OT follow up    Assistance Recommended at Discharge PRN  Patient can return home with the following      Functional Status Assessment  Patient has not had a recent decline in their functional status  Equipment Recommendations  None recommended by OT    Recommendations for Other Services       Precautions / Restrictions Precautions Precautions: Cervical Precaution Booklet Issued: Yes (comment) Precaution Comments: Issued and verbalized Required Braces or Orthoses: Cervical Brace Cervical Brace: Hard collar;Soft collar;For comfort Restrictions Weight Bearing Restrictions: No      Mobility Bed Mobility Overal bed mobility: Modified Independent                  Transfers Overall transfer level: Independent                        Balance Overall balance assessment: No apparent balance deficits (not formally assessed)                                         ADL either performed or assessed with clinical judgement    ADL Overall ADL's : At baseline                                       General ADL Comments: generalized cueing for technique, but Mod I     Vision Patient Visual Report: No change from baseline       Perception Perception Perception: Not tested   Praxis Praxis Praxis: Not tested    Pertinent Vitals/Pain Pain Assessment Pain Assessment: Faces Faces Pain Scale: Hurts little more Pain Location: upper traps and back Pain Descriptors / Indicators: Operative site guarding Pain Intervention(s): Monitored during session     Hand Dominance Right   Extremity/Trunk Assessment Upper Extremity Assessment Upper Extremity Assessment: LUE deficits/detail LUE Deficits / Details: 4/5 for bicep and tricep, 3+/5 grip LUE Sensation: WNL LUE Coordination: WNL   Lower Extremity Assessment Lower Extremity Assessment: Overall WFL for tasks assessed   Cervical / Trunk Assessment Cervical / Trunk Assessment: Neck Surgery   Communication Communication Communication: No difficulties   Cognition Arousal/Alertness: Awake/alert Behavior During Therapy: WFL for tasks assessed/performed Overall Cognitive Status: Within Functional Limits for tasks assessed  General Comments   VSS and no dizziness noted    Exercises  Educated on shoulder shrugs, shoulder rolls and scapular pinches.     Shoulder Instructions      Home Living Family/patient expects to be discharged to:: Private residence Living Arrangements: Spouse/significant other Available Help at Discharge: Family;Available 24 hours/day Type of Home: House Home Access: Level entry     Home Layout: Two level;Bed/bath upstairs Alternate Level Stairs-Number of Steps: 13 Alternate Level Stairs-Rails: Right;Left Bathroom Shower/Tub: Tub/shower unit;Walk-in shower   Bathroom Toilet: Standard Bathroom Accessibility: Yes How Accessible: Accessible via walker Home  Equipment: None          Prior Functioning/Environment Prior Level of Function : Independent/Modified Independent;Driving;Working/employed                        OT Problem List: Pain;Decreased strength      OT Treatment/Interventions:      OT Goals(Current goals can be found in the care plan section) Acute Rehab OT Goals Patient Stated Goal: Return home OT Goal Formulation: With patient Time For Goal Achievement: 07/16/22 Potential to Achieve Goals: Good  OT Frequency:      Co-evaluation              AM-PAC OT "6 Clicks" Daily Activity     Outcome Measure Help from another person eating meals?: None Help from another person taking care of personal grooming?: None Help from another person toileting, which includes using toliet, bedpan, or urinal?: None Help from another person bathing (including washing, rinsing, drying)?: None Help from another person to put on and taking off regular upper body clothing?: None Help from another person to put on and taking off regular lower body clothing?: None 6 Click Score: 24   End of Session Equipment Utilized During Treatment: Cervical collar Nurse Communication: Mobility status  Activity Tolerance: Patient tolerated treatment well Patient left: in chair;with call bell/phone within reach;with family/visitor present  OT Visit Diagnosis: Muscle weakness (generalized) (M62.81)                Time: 4656-8127 OT Time Calculation (min): 21 min Charges:  OT General Charges $OT Visit: 1 Visit OT Evaluation $OT Eval Moderate Complexity: 1 Mod  07/12/2022  RP, OTR/L  Acute Rehabilitation Services  Office:  [phone]   Suzanna Obey 07/12/2022, 5:30 PM

## 2022-07-13 ENCOUNTER — Encounter (HOSPITAL_COMMUNITY): Payer: Self-pay | Admitting: Orthopedic Surgery

## 2022-07-13 NOTE — Anesthesia Postprocedure Evaluation (Signed)
Anesthesia Post Note  Patient: Astronomer  Procedure(s) Performed: ANTERIOR CERVICAL DISCECTOMY AND FUSION CERVICAL FIVE THROUGH SEVEN (Spine Cervical)     Patient location during evaluation: PACU Anesthesia Type: General Level of consciousness: awake and alert Pain management: pain level controlled Vital Signs Assessment: post-procedure vital signs reviewed and stable Respiratory status: spontaneous breathing, nonlabored ventilation, respiratory function stable and patient connected to nasal cannula oxygen Cardiovascular status: blood pressure returned to baseline and stable Postop Assessment: no apparent nausea or vomiting Anesthetic complications: no   No notable events documented.  Last Vitals:  Vitals:   07/12/22 1231 07/12/22 1628  BP: 118/86 126/83  Pulse: 90 78  Resp: 18 18  Temp: 36.6 C 37.1 C  SpO2: 95% 95%    Last Pain:  Vitals:   07/12/22 1741  TempSrc:   PainSc: 7                  Iveth Heidemann L Jashayla Glatfelter

## 2022-07-17 NOTE — Discharge Summary (Signed)
Patient ID: Phillip Wiley MRN: [id number] DOB/AGE: 44-Sep-1977 44 y.o.  Admit date: 07/12/2022 Discharge date: 07/17/2022  Admission Diagnoses:  Principal Problem:   Cervical disc herniation   Discharge Diagnoses:  Principal Problem:   Cervical disc herniation  status post Procedure(s): ANTERIOR CERVICAL DISCECTOMY AND FUSION CERVICAL FIVE THROUGH SEVEN  Past Medical History:  Diagnosis Date   GERD (gastroesophageal reflux disease)    Headache     Surgeries: Procedure(s): ANTERIOR CERVICAL DISCECTOMY AND FUSION CERVICAL FIVE THROUGH SEVEN on 07/12/2022   Consultants:   Discharged Condition: Improved  Hospital Course: Phillip Wiley is an 46 y.o. male who was admitted 07/12/2022 for operative treatment of Cervical disc herniation. Patient failed conservative treatments (please see the history and physical for the specifics) and had severe unremitting pain that affects sleep, daily activities and work/hobbies. After pre-op clearance, the patient was taken to the operating room on 07/12/2022 and underwent  Procedure(s): ANTERIOR CERVICAL DISCECTOMY AND FUSION CERVICAL FIVE THROUGH SEVEN.    Patient was given perioperative antibiotics:  Anti-infectives (From admission, onward)    Start     Dose/Rate Route Frequency Ordered Stop   07/12/22 2300  vancomycin (VANCOCIN) IVPB 1000 mg/200 mL premix  Status:  Discontinued        1,000 mg 200 mL/hr over 60 Minutes Intravenous  Once 07/12/22 1333 07/13/22 0013   07/12/22 0553  vancomycin (VANCOREADY) IVPB 1500 mg/300 mL        1,500 mg 150 mL/hr over 120 Minutes Intravenous 120 min pre-op 07/12/22 0553 07/12/22 0845        Patient was given sequential compression devices and early ambulation to prevent DVT.   Patient benefited maximally from hospital stay and there were no complications. At the time of discharge, the patient was urinating/moving their bowels without difficulty, tolerating a regular diet, pain is controlled  with oral pain medications and they have been cleared by PT/OT.   Recent vital signs: No data found.   Recent laboratory studies: No results for input(s): "WBC", "HGB", "HCT", "PLT", "NA", "K", "CL", "CO2", "BUN", "CREATININE", "GLUCOSE", "INR", "CALCIUM" in the last 72 hours.  Invalid input(s): "PT", "2"   Discharge Medications:   Allergies as of 07/12/2022       Reactions   Amoxicillin Hives, Itching   Above 500 mg        Medication List     STOP taking these medications    CINNAMON PO   gabapentin 300 MG capsule Commonly known as: NEURONTIN   HYDROcodone-acetaminophen 5-325 MG tablet Commonly known as: NORCO/VICODIN   OVER THE COUNTER MEDICATION       TAKE these medications    methocarbamol 500 MG tablet Commonly known as: ROBAXIN Take 1 tablet (500 mg total) by mouth every 8 (eight) hours as needed for up to 5 days for muscle spasms.   ondansetron 4 MG tablet Commonly known as: Zofran Take 1 tablet (4 mg total) by mouth every 8 (eight) hours as needed for nausea or vomiting.   oxyCODONE-acetaminophen 10-325 MG tablet Commonly known as: Percocet Take 1 tablet by mouth every 6 (six) hours as needed for up to 5 days for pain.        Diagnostic Studies: DG Cervical Spine 2 or 3 views  Result Date: 07/12/2022 CLINICAL DATA:  [id number] anterior cervical discectomy and fusion 5 through 7 vertebrae EXAM: CERVICAL SPINE-two views, 4 images Fluoro time: 53.6 seconds. Fluoro dose: 4.59 mGy COMPARISON:  August 28, 2021 FINDINGS: Findings: Images were performed intraoperatively without  presence of a radiologist. There are ACDF changes seen at C5 through C7 with intervertebral disc cage. The alignment is near anatomical. There is an endotracheal tube in place . Please see intraoperative findings for further detail. IMPRESSION: ACDF changes at C5 through C7 with intervertebral disc cages. The alignment is near anatomical. Electronically Signed   By: Marjo Bicker M.D.    On: 07/12/2022 11:51   DG C-Arm 1-60 Min-No Report  Result Date: 07/12/2022 Fluoroscopy was utilized by the requesting physician.  No radiographic interpretation.   DG C-Arm 1-60 Min-No Report  Result Date: 07/12/2022 Fluoroscopy was utilized by the requesting physician.  No radiographic interpretation.   DG C-Arm 1-60 Min-No Report  Result Date: 07/12/2022 Fluoroscopy was utilized by the requesting physician.  No radiographic interpretation.    Discharge Instructions     Incentive spirometry RT   Complete by: As directed         Follow-up Information     Venita Lick, MD. Schedule an appointment as soon as possible for a visit in 2 week(s).   Specialty: Orthopedic Surgery Why: If symptoms worsen, For suture removal, For wound re-check Contact information: [address] [place] [address] [phone]                 Discharge Plan:  discharge to home  Disposition: Patient is doing quite well.  Tolerating regular diet, no swelling at the incision site, no shortness of breath or difficulty breathing.  Patient has been cleared by PT and OT.  Charge instructions have been provided as have discharge medications for pain, spasms, and nausea.  Patient will follow-up with me in 2 weeks as scheduled.    Signed: Alvy Beal for Dr. Venita Lick [facility] [phone] 07/17/2022, 7:36 AM
